# Patient Record
Sex: Female | Born: 1950
Health system: Southern US, Community
[De-identification: ages and names within clinical notes are randomized; demographics above are authoritative.]

## PROBLEM LIST (undated history)

## (undated) DIAGNOSIS — J302 Other seasonal allergic rhinitis: Secondary | ICD-10-CM

## (undated) DIAGNOSIS — J449 Chronic obstructive pulmonary disease, unspecified: Secondary | ICD-10-CM

## (undated) DIAGNOSIS — C439 Malignant melanoma of skin, unspecified: Secondary | ICD-10-CM

## (undated) HISTORY — DX: Chronic obstructive pulmonary disease, unspecified: J44.9

## (undated) HISTORY — DX: Malignant melanoma of skin, unspecified: C43.9

## (undated) HISTORY — PX: OVARIAN CYST REMOVAL: SHX89

## (undated) HISTORY — PX: ABDOMINAL HYSTERECTOMY: SHX81

## (undated) HISTORY — PX: APPENDECTOMY: SHX54

---

## 2005-04-03 ENCOUNTER — Encounter: Payer: Self-pay | Admitting: Family Medicine

## 2007-09-17 ENCOUNTER — Encounter: Payer: Self-pay | Admitting: Family Medicine

## 2008-11-23 ENCOUNTER — Encounter: Admission: RE | Admit: 2008-11-23 | Discharge: 2008-11-23 | Payer: Self-pay | Admitting: Sports Medicine

## 2009-04-20 ENCOUNTER — Ambulatory Visit: Payer: Self-pay | Admitting: Family Medicine

## 2009-04-20 DIAGNOSIS — C439 Malignant melanoma of skin, unspecified: Secondary | ICD-10-CM | POA: Insufficient documentation

## 2009-04-20 DIAGNOSIS — H6980 Other specified disorders of Eustachian tube, unspecified ear: Secondary | ICD-10-CM | POA: Insufficient documentation

## 2009-05-02 ENCOUNTER — Encounter: Payer: Self-pay | Admitting: Family Medicine

## 2009-05-02 LAB — CONVERTED CEMR LAB
Albumin: 4.4 g/dL (ref 3.5–5.2)
Alkaline Phosphatase: 76 units/L (ref 39–117)
BUN: 14 mg/dL (ref 6–23)
Cholesterol: 269 mg/dL — ABNORMAL HIGH (ref 0–200)
Glucose, Bld: 91 mg/dL (ref 70–99)
HDL: 53 mg/dL (ref >39)
LDL Cholesterol: 176 mg/dL — ABNORMAL HIGH (ref 0–99)
Potassium: 5 meq/L (ref 3.5–5.3)
Total Bilirubin: 0.5 mg/dL (ref 0.3–1.2)
Triglycerides: 201 mg/dL — ABNORMAL HIGH (ref <150)

## 2009-05-18 ENCOUNTER — Encounter: Payer: Self-pay | Admitting: Family Medicine

## 2009-05-18 ENCOUNTER — Other Ambulatory Visit: Admission: RE | Admit: 2009-05-18 | Discharge: 2009-05-18 | Payer: Self-pay | Admitting: Family Medicine

## 2009-05-18 ENCOUNTER — Ambulatory Visit: Payer: Self-pay | Admitting: Family Medicine

## 2009-05-18 DIAGNOSIS — E785 Hyperlipidemia, unspecified: Secondary | ICD-10-CM | POA: Insufficient documentation

## 2009-05-18 DIAGNOSIS — Z780 Asymptomatic menopausal state: Secondary | ICD-10-CM | POA: Insufficient documentation

## 2009-07-11 ENCOUNTER — Encounter: Admission: RE | Admit: 2009-07-11 | Discharge: 2009-07-11 | Payer: Self-pay | Admitting: Family Medicine

## 2009-07-11 ENCOUNTER — Encounter: Payer: Self-pay | Admitting: Family Medicine

## 2009-07-18 DIAGNOSIS — R928 Other abnormal and inconclusive findings on diagnostic imaging of breast: Secondary | ICD-10-CM | POA: Insufficient documentation

## 2009-07-25 ENCOUNTER — Encounter: Admission: RE | Admit: 2009-07-25 | Discharge: 2009-07-25 | Payer: Self-pay | Admitting: Family Medicine

## 2009-08-07 ENCOUNTER — Telehealth: Payer: Self-pay | Admitting: Family Medicine

## 2009-08-08 ENCOUNTER — Encounter: Payer: Self-pay | Admitting: Family Medicine

## 2009-08-10 ENCOUNTER — Encounter: Payer: Self-pay | Admitting: Family Medicine

## 2009-08-10 LAB — CONVERTED CEMR LAB
ALT: 37 units/L — ABNORMAL HIGH (ref 0–35)
ALT: 37 units/L — ABNORMAL HIGH (ref 0–35)
AST: 25 units/L (ref 0–37)
AST: 25 units/L (ref 0–37)
Alkaline Phosphatase: 85 units/L (ref 39–117)
Calcium: 9.2 mg/dL (ref 8.4–10.5)
Chloride: 108 meq/L (ref 96–112)
Creatinine, Ser: 0.78 mg/dL (ref 0.40–1.20)
LDL Cholesterol: 108 mg/dL — ABNORMAL HIGH (ref 0–99)
Total Bilirubin: 0.4 mg/dL (ref 0.3–1.2)
Total CHOL/HDL Ratio: 4.1

## 2009-09-22 ENCOUNTER — Ambulatory Visit: Payer: Self-pay | Admitting: Family Medicine

## 2009-09-22 DIAGNOSIS — J329 Chronic sinusitis, unspecified: Secondary | ICD-10-CM | POA: Insufficient documentation

## 2010-09-09 ENCOUNTER — Encounter: Payer: Self-pay | Admitting: Family Medicine

## 2010-09-18 NOTE — Assessment & Plan Note (Signed)
Summary: Sinusitis   Vital Signs:  Patient profile:   60 year old female Menstrual status:  partial hysterectomy Height:      66 inches Weight:      184 pounds BMI:     29.81 O2 Sat:      97 % on Room air Temp:     98.1 degrees F oral Pulse rate:   85 / minute BP sitting:   129 / 85  Vitals Entered By: Charolett Bumpers (September 22, 2009 9:20 AM)  O2 Flow:  Room air CC: cough   Primary Care Provider:  Seymour Bars DO  CC:  cough.  History of Present Illness: Cough for 1 week.  last week  aches and ST, no N/V/D or fever. No fever this week.  + flu contacts.  She is a smoker. Productive cough with yellow. No wheeze or SOB. Sinus pressure maxillary bilat. Lots of post nasal drip. Stopped her nasal spray. No OTC eds currently. No hx of respiratory problems.   Allergies: No Known Drug Allergies  Physical Exam  General:  Well-developed,well-nourished,in no acute distress; alert,appropriate and cooperative throughout examination Head:  Normocephalic and atraumatic without obvious abnormalities. No apparent alopecia or balding. Eyes:  No corneal or conjunctival inflammation noted. EOMI. Perrla.  Ears:  External ear exam shows no significant lesions or deformities.  Otoscopic examination reveals clear canals, tympanic membranes are intact bilaterally without bulging, retraction, inflammation or discharge. Hearing is grossly normal bilaterally. Nose:  External nasal examination shows no deformity or inflammation. Mouth:  Oral mucosa and oropharynx without lesions or exudates.  Teeth in good repair. Neck:  No deformities, masses, or tenderness noted. Lungs:  Normal respiratory effort, chest expands symmetrically. Lungs are clear to auscultation, no crackles or wheezes. Heart:  Normal rate and regular rhythm. S1 and S2 normal without gallop, murmur, click, rub or other extra sounds. Skin:  no rashes.   Cervical Nodes:  No lymphadenopathy noted Psych:  Cognition and judgment appear intact.  Alert and cooperative with normal attention span and concentration. No apparent delusions, illusions, hallucinations   Impression & Recommendations:  Problem # 1:  SINUSITIS (ICD-473.9)  Her updated medication list for this problem includes:    Nasonex 50 Mcg/act Susp (Mometasone furoate) .[name]... 2 sprays per nostril daily    Amoxicillin 500 Mg Cap (Amoxicillin) .[name]... Take 1 capsule by mouth three times a day x 10 days  Take antibiotics for full duration Call if not better in one week.   Complete Medication List: 1)  Otc Allergy Med  .... Take 1 tab by mouth once daily as needed 2)  Nasonex 50 Mcg/act Susp (Mometasone furoate) .... 2 sprays per nostril daily 3)  Crestor 10 Mg Tabs (Rosuvastatin calcium) .[name].. 1 tab by mouth qhs 4)  Vitamin D 50,000 Iu  .[name].. 1 capsule by mouth once a wk 5)  Amoxicillin 500 Mg Cap (Amoxicillin) .... Take 1 capsule by mouth three times a day x 10 days Prescriptions: AMOXICILLIN 500 MG CAP (AMOXICILLIN) Take 1 capsule by mouth three times a day X 10 days  #30 x 0   Entered and Authorized by:   Nani Gasser MD   Signed by:   Nani Gasser MD on 09/22/2009   Method used:   [facility]        [facility]  [address] [phone][facility] (retail[facility]       [address]       [place], [place]  [address]  Ph: [phone] or [phone]       Fax: [phone]   RxID:   [id number]

## 2010-09-20 ENCOUNTER — Encounter: Payer: Self-pay | Admitting: Family Medicine

## 2010-09-20 ENCOUNTER — Ambulatory Visit (INDEPENDENT_AMBULATORY_CARE_PROVIDER_SITE_OTHER): Payer: BC Managed Care – PPO | Admitting: Family Medicine

## 2010-09-20 DIAGNOSIS — J189 Pneumonia, unspecified organism: Secondary | ICD-10-CM | POA: Insufficient documentation
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
## 2010-09-27 ENCOUNTER — Telehealth: Payer: Self-pay | Admitting: Family Medicine

## 2010-09-28 ENCOUNTER — Telehealth: Payer: Self-pay | Admitting: Family Medicine

## 2010-10-01 ENCOUNTER — Telehealth: Payer: Self-pay | Admitting: Family Medicine

## 2010-10-04 ENCOUNTER — Encounter (INDEPENDENT_AMBULATORY_CARE_PROVIDER_SITE_OTHER): Payer: BC Managed Care – PPO | Admitting: Family Medicine

## 2010-10-04 ENCOUNTER — Encounter: Payer: Self-pay | Admitting: Family Medicine

## 2010-10-04 DIAGNOSIS — J4489 Other specified chronic obstructive pulmonary disease: Secondary | ICD-10-CM | POA: Insufficient documentation

## 2010-10-04 DIAGNOSIS — F17200 Nicotine dependence, unspecified, uncomplicated: Secondary | ICD-10-CM

## 2010-10-04 DIAGNOSIS — J449 Chronic obstructive pulmonary disease, unspecified: Secondary | ICD-10-CM

## 2010-10-04 NOTE — Progress Notes (Signed)
  Phone Note Other Incoming   Summary of Call: Call a nurse: Pt requesting rx for steroid inhaler.  I reviewed chart and noted Dr. Ovidio Kin plan to give pt symbicort today, so I authorized the nurse Aram Beecham at Call a nurse) to call in symbicort 160/4.5, 2 puffs two times a day, #1, no RF. Initial call taken by: Michell Heinrich M.D.,  September 28, 2010 9:19 PM

## 2010-10-04 NOTE — Progress Notes (Signed)
Summary: [facility]Cough  Phone Note Call from Patient Call back at Work Phone [phone] Call back at [phone] after 5   Caller: Patient Call For: Seymour Bars DO Reason for Call: Acute Illness Summary of Call: pt was seen last week and given antibiotics.  Pt was given albuterol inhaler,but respiratory therapist at pt's office suggested steroid inhaler.  Cough is worse and pt can not sleep at night.  Pt denies any fever, pt has productive cough.  Sputum is lighter in color and not as thick.  [facility]  Please advise.  pt has followup next week for physical. Initial call taken by: Francee Piccolo CMA Duncan Dull),  September 27, 2010 3:53 PM  Follow-up for Phone Call        ask her if she wants to pick up a Symbicort inhaler to use for the next wk and I will send an Rx for nighttime cough medicine to the pharmacy. Follow-up by: Seymour Bars DO,  September 28, 2010 12:45 PM    New/Updated Medications: Sandria Senter ER 10-8 MG/5ML LQCR (HYDROCOD POLST-CHLORPHEN POLST) 5 ml by mouth at bedtime as needed cough Prescriptions: TUSSIONEX PENNKINETIC ER 10-8 MG/5ML LQCR (HYDROCOD POLST-CHLORPHEN POLST) 5 ml by mouth at bedtime as needed cough  #100 ml x 0   Entered and Authorized by:   Seymour Bars DO   Signed by:   Seymour Bars DO on 09/28/2010   Method used:   Printed then faxed to ...       [facility]  [address] [phone]* (retail)       [address] [place], [place]  [address]       Ph: [phone] or [phone]       Fax: [phone]   RxID:   [id number]   Appended Document: [facility] Clinical Lists Changes  Pt needs symbicort sent to pharmacy.  She is unable to pick up sample by 5pm.  Advised cough syrup called in also. Francee Piccolo CMA Duncan Dull)  September 28, 2010 2:10 PM

## 2010-10-10 NOTE — Progress Notes (Signed)
Summary: CALL A NURSE  Phone Note Call from Patient   Caller: CALL A NURSE Summary of Call: [facility] Triage Call Report Triage Record Num: [id number] Operator: Caswell Corwin Patient Name: Olivia Frank Call Date & Time: 09/28/2010 8:47:27PM Patient Phone: [phone] PCP: Patient Gender: Female PCP Fax : Patient DOB: 08/14/1951 Practice Name: [facility] Reason for Call: Pt calling that she needs her steroid inhaler called into the Pharmacy. States she spoke with the ofc earlier and they were offered her a sample but she could not get to the ofc on time so they were going to call it in. Was in last week for presumed pneumonia and cough has gotten worse. Afebrile. Triaged cough and coughing up green sputum. Paged Dr. Earley Favor and he will look her up and call me back. Dr. Milinda Cave called back and ordered a Symbicort inhaler 160/4.5 2 puffs BID #1 with no refill. [facility] @ [phone] and spoke wtih Onalee Hua. Recalled pt and gave her info. Initial call taken by: Payton Spark CMA,  October 01, 2010 8:31 AM
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
## 2011-05-27 ENCOUNTER — Encounter: Payer: Self-pay | Admitting: Family Medicine

## 2011-05-27 ENCOUNTER — Inpatient Hospital Stay (INDEPENDENT_AMBULATORY_CARE_PROVIDER_SITE_OTHER)
Admission: RE | Admit: 2011-05-27 | Discharge: 2011-05-27 | Disposition: A | Discharge status: Home / Self Care | Payer: BC Managed Care – PPO | Source: Ambulatory Visit | Attending: Family Medicine | Admitting: Family Medicine

## 2011-05-27 ENCOUNTER — Telehealth: Payer: Self-pay | Admitting: Family Medicine

## 2011-05-27 ENCOUNTER — Emergency Department (HOSPITAL_COMMUNITY)
Admission: EM | Admit: 2011-05-27 | Discharge: 2011-05-27 | Discharge status: Against Medical Advice | Payer: BC Managed Care – PPO | Attending: Emergency Medicine | Admitting: Emergency Medicine

## 2011-05-27 DIAGNOSIS — M79609 Pain in unspecified limb: Secondary | ICD-10-CM | POA: Insufficient documentation

## 2011-05-27 DIAGNOSIS — IMO0002 Reserved for concepts with insufficient information to code with codable children: Secondary | ICD-10-CM | POA: Insufficient documentation

## 2011-05-27 NOTE — Telephone Encounter (Signed)
Pt called left a voicemail request to speak with nurse about infected thumb. Pt states she went to urgent care out of town and it still isn't any better. Pt request a call back [phone]

## 2011-05-27 NOTE — Telephone Encounter (Signed)
Called pt and she states she already spoke with someone in our office and was in UC as we spoke getting treated. KJ LPN

## 2011-05-28 ENCOUNTER — Ambulatory Visit: Payer: BC Managed Care – PPO | Admitting: Family Medicine

## 2011-05-28 ENCOUNTER — Telehealth (INDEPENDENT_AMBULATORY_CARE_PROVIDER_SITE_OTHER): Payer: Self-pay | Admitting: *Deleted

## 2011-07-22 NOTE — Telephone Encounter (Signed)
  Phone Note Call from Patient   Caller: Patient Summary of Call: 05/28/11- pt called and states that she went to [facility] long ED as instructed by dr Cathren Harsh. she left the ED without seeing Dr Melvyn Novas. She states that she paged him and he is going to see her in his office today.  05/29/11- spoke to pt she states that she saw dr Melvyn Novas on 05/28/11, he removed her fingernail, and sent a culture to the lab. advised her to f/u with dr Melvyn Novas as he reccomends. pt agrees/Marion Seese,LPN Initial call taken by: Clemens Catholic LPN,  May 28, 2011 10:18 AM
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
## 2011-10-08 ENCOUNTER — Ambulatory Visit (INDEPENDENT_AMBULATORY_CARE_PROVIDER_SITE_OTHER): Payer: BC Managed Care – PPO | Admitting: Physician Assistant

## 2011-10-08 ENCOUNTER — Encounter: Payer: Self-pay | Admitting: Physician Assistant

## 2011-10-08 VITALS — BP 146/83 | HR 76 | Wt 182.0 lb

## 2011-10-08 DIAGNOSIS — Z0000 Encounter for general adult medical examination without abnormal findings: Secondary | ICD-10-CM

## 2011-10-08 DIAGNOSIS — R200 Anesthesia of skin: Secondary | ICD-10-CM

## 2011-10-08 DIAGNOSIS — S46919A Strain of unspecified muscle, fascia and tendon at shoulder and upper arm level, unspecified arm, initial encounter: Secondary | ICD-10-CM

## 2011-10-08 DIAGNOSIS — E538 Deficiency of other specified B group vitamins: Secondary | ICD-10-CM

## 2011-10-08 DIAGNOSIS — IMO0002 Reserved for concepts with insufficient information to code with codable children: Secondary | ICD-10-CM

## 2011-10-08 DIAGNOSIS — R030 Elevated blood-pressure reading, without diagnosis of hypertension: Secondary | ICD-10-CM

## 2011-10-08 DIAGNOSIS — R209 Unspecified disturbances of skin sensation: Secondary | ICD-10-CM

## 2011-10-08 MED ORDER — CYCLOBENZAPRINE HCL 5 MG PO TABS: 5.0000 mg | ORAL_TABLET | Freq: Three times a day (TID) | ORAL | Status: DC | PRN

## 2011-10-08 MED ORDER — TRAMADOL HCL 50 MG PO TABS: 50.0000 mg | ORAL_TABLET | Freq: Four times a day (QID) | ORAL | Status: AC | PRN

## 2011-10-08 NOTE — Patient Instructions (Signed)
Can take regular ibuprofen up 800mg  three times a day. Try ice 20 minute couple times a day. Flexeril at night. Tramadol as needed for pain. Follow up next week. Get labs.

## 2011-10-08 NOTE — Progress Notes (Signed)
  Subjective:    Patient ID: Olivia Frank, female    DOB: 11-14-1950, 61 y.o.   MRN: [id number]  HPI Patient presents to the clinic with left arm pain and numbness. She has not had anything like this before and denies injury. The pain started Sunday night as mild pain in the upper left back shoulder blade region and now is numbness and tingling down left arm. It is gradually getting worse. She has tried [employer] and it has not helped. She denies chest pain, SOB, wheezing, jaw pain, dizziness, vision changes. Her father had multiple blockages and MI's. She grades pain 8/10. Nothing makes it worse or better.     Review of Systems     Objective:   Physical Exam  Constitutional: She is oriented to person, place, and time. She appears well-developed and well-nourished. No distress.  HENT:  Head: Normocephalic and atraumatic.  Cardiovascular: Normal rate, regular rhythm, normal heart sounds and intact distal pulses.   Pulmonary/Chest: Effort normal and breath sounds normal.  Musculoskeletal:       Arms:      Strength 5/5 both arms and wrist. Normal reflexes. Normal sensations felt bilaterally.  Neurological: She is alert and oriented to person, place, and time.  Skin: Skin is warm and dry. She is not diaphoretic.  Psychiatric: She has a normal mood and affect. Her behavior is normal.          Assessment & Plan:  Elevated blood pressure- patient wants to wait until next week to talk about medication for blood pressure. She not had blood pressure problems in the past but the last two visit have been elevated. We will discuss at CPE next week.   Numbness and tingling of left arm-EKG(NSR, good r wave progression, and no ST elevation)  Muscle strain of scapula, left side- Instructed to ice 20 min a couple times a day. Can take up to Ibuprfoen 800mg  TID. Gave flexeril to take at night along with warm bath and tramadol to take as needed for pain. I encouraged a massage would be the best  treatment.

## 2011-10-09 ENCOUNTER — Encounter: Payer: Self-pay | Admitting: *Deleted

## 2011-10-10 LAB — COMPREHENSIVE METABOLIC PANEL
ALT: 22 U/L (ref 0–35)
Alkaline Phosphatase: 77 U/L (ref 39–117)
Creat: 0.84 mg/dL (ref 0.50–1.10)
Sodium: 140 mEq/L (ref 135–145)
Total Bilirubin: 0.5 mg/dL (ref 0.3–1.2)
Total Protein: 6.4 g/dL (ref 6.0–8.3)

## 2011-10-10 LAB — LIPID PANEL
HDL: 53 mg/dL (ref >39)
LDL Cholesterol: 175 mg/dL — ABNORMAL HIGH (ref 0–99)
Total CHOL/HDL Ratio: 5 Ratio
Triglycerides: 197 mg/dL — ABNORMAL HIGH (ref <150)
VLDL: 39 mg/dL (ref 0–40)

## 2011-10-11 ENCOUNTER — Encounter: Payer: Self-pay | Admitting: *Deleted

## 2011-10-15 ENCOUNTER — Encounter: Payer: Self-pay | Admitting: Physician Assistant

## 2011-10-15 ENCOUNTER — Ambulatory Visit (INDEPENDENT_AMBULATORY_CARE_PROVIDER_SITE_OTHER): Payer: BC Managed Care – PPO | Admitting: Physician Assistant

## 2011-10-15 VITALS — BP 138/84 | HR 84 | Wt 185.0 lb

## 2011-10-15 DIAGNOSIS — Z1211 Encounter for screening for malignant neoplasm of colon: Secondary | ICD-10-CM

## 2011-10-15 DIAGNOSIS — Z1231 Encounter for screening mammogram for malignant neoplasm of breast: Secondary | ICD-10-CM

## 2011-10-15 DIAGNOSIS — Z720 Tobacco use: Secondary | ICD-10-CM

## 2011-10-15 DIAGNOSIS — Z1239 Encounter for other screening for malignant neoplasm of breast: Secondary | ICD-10-CM

## 2011-10-15 DIAGNOSIS — Z0000 Encounter for general adult medical examination without abnormal findings: Secondary | ICD-10-CM

## 2011-10-15 DIAGNOSIS — M791 Myalgia, unspecified site: Secondary | ICD-10-CM

## 2011-10-15 DIAGNOSIS — Z23 Encounter for immunization: Secondary | ICD-10-CM

## 2011-10-15 DIAGNOSIS — E785 Hyperlipidemia, unspecified: Secondary | ICD-10-CM

## 2011-10-15 MED ORDER — GABAPENTIN 100 MG PO CAPS: 100.0000 mg | ORAL_CAPSULE | Freq: Four times a day (QID) | ORAL | Status: DC

## 2011-10-15 NOTE — Progress Notes (Signed)
Subjective:     Olivia Frank is a 61 y.o. female and is here for a comprehensive physical exam. The patient reports that she is still having problems with her left shoulder and arm with pain and numbness. She has been using the flexeril, Ibuprofen, and stretches from last visit. She reports that it might have helped some but not much. She did not get a massage which she thinks would help. She denies dropping anything or any numbness in the left hand. Her strength has not been affected.   History   Social History  . Marital Status: Married    Spouse Name: N/A    Number of Children: N/A  . Years of Education: N/A   Occupational History  . Not on file.   Social History Main Topics  . Smoking status: Current Everyday Smoker -- 0.5 packs/day for 46 years    Types: Cigarettes  . Smokeless tobacco: Not on file  . Alcohol Use: No  . Drug Use: No  . Sexually Active: Not on file   Other Topics Concern  . Not on file   Social History Narrative  . No narrative on file   Health Maintenance  Topic Date Due  . Colonoscopy  08/23/2000  . Zostavax  08/23/2010  . Influenza Vaccine  05/20/2011  . Mammogram  07/26/2011  . Tetanus/tdap  05/19/2019    The following portions of the patient's history were reviewed and updated as appropriate: allergies, current medications, past family history, past medical history, past social history, past surgical history and problem list.  Review of Systems Pertinent items are noted in HPI.   Objective:    BP 138/84  Pulse 84  Wt 185 lb (83.915 kg) General appearance: alert, cooperative and appears stated age Head: Normocephalic, without obvious abnormality, atraumatic Eyes: conjunctivae/corneas clear. PERRL, EOM's intact. Fundi benign. Ears: normal TM's and external ear canals both ears Nose: Nares normal. Septum midline. Mucosa normal. No drainage or sinus tenderness. Throat: lips, mucosa, and tongue normal; teeth and gums normal Neck: no adenopathy,  no carotid bruit, no JVD, supple, symmetrical, trachea midline and thyroid not enlarged, symmetric, no tenderness/mass/nodules Back: symmetric, no curvature. ROM normal. No CVA tenderness., Tenderness to palpation over the trapizeus muscle on the left side extending from base of neck down to acromion process. No pain with side to side movements of head and flexion and extentsion.  Lungs: clear to auscultation bilaterally Breasts: normal appearance, no masses or tenderness, Inspection negative, No nipple retraction or dimpling Heart: regular rate and rhythm, S1, S2 normal, no murmur, click, rub or gallop Abdomen: soft, non-tender; bowel sounds normal; no masses,  no organomegaly Extremities: extremities normal, atraumatic, no cyanosis or edema and Tenderness with palpation of the ulnar nerve over the olecranon process.Strenth 5/5 in all extemites and cervical spine. Pulses: 2+ and symmetric  Neurological: Alert and oriented times 3. Cranial Nerves II-XII intact.   Assessment:    Healthy female exam.       Plan:  Will order Mammogram and Colonoscopy. Someone will call to schedule.  Zostavax was given.   Discussed tobacco cessation- Patient not ready to quit. Will schedule spirometry due to history of smoking.  Left muscle pain with numbness and tingling- Continue treating as a muscle sprain with heat and ice alternating. Flexeril as needed at night. Do stretches daily to left arm and shoulder. Follow up if not improving and we will get Xray of cervical neck and follow up with EMG to further evaluate numbness and  tingling.  Dyslipidemia-LDL at 175. Triglycerides at 197. Patient does not want to start medications at this time. She will consider Fish Oil 1000mg  TID and she states she will start dieting and exercise. She knows she needs to lose weight and she wants to try this. Will Recheck in 3 months.  Denied Dexa for this year but instructed to make sure she was taking at least 500mg  BID or at  least 4 servings of dairy.    See After Visit Summary for Counseling Recommendations

## 2011-10-15 NOTE — Patient Instructions (Addendum)
Discussed with you the importance of weight loss and exercise to decrease your cholesterol and to improve your blood pressure. We need to recheck in 3 months and if not decreasing then we need to start medication. Consider Calcium 500mg  twice a day or 4 servings of dairy daily. Will call with mammogram and colonoscopy.Consideer starting Fish Oil 1000mg  three times a day for cholesterol.

## 2011-10-23 ENCOUNTER — Telehealth: Payer: Self-pay | Admitting: *Deleted

## 2011-11-06 ENCOUNTER — Ambulatory Visit
Admission: RE | Admit: 2011-11-06 | Discharge: 2011-11-06 | Disposition: A | Discharge status: Home / Self Care | Payer: BC Managed Care – PPO | Source: Ambulatory Visit | Attending: Physician Assistant | Admitting: Physician Assistant

## 2011-11-06 DIAGNOSIS — Z1231 Encounter for screening mammogram for malignant neoplasm of breast: Secondary | ICD-10-CM

## 2012-01-30 ENCOUNTER — Encounter: Payer: Self-pay | Admitting: Family Medicine

## 2012-01-30 ENCOUNTER — Ambulatory Visit (INDEPENDENT_AMBULATORY_CARE_PROVIDER_SITE_OTHER): Payer: BC Managed Care – PPO | Admitting: Family Medicine

## 2012-01-30 VITALS — BP 142/81 | HR 81 | Temp 98.3°F

## 2012-01-30 DIAGNOSIS — H68012 Acute Eustachian salpingitis, left ear: Secondary | ICD-10-CM

## 2012-01-30 DIAGNOSIS — H6120 Impacted cerumen, unspecified ear: Secondary | ICD-10-CM

## 2012-01-30 DIAGNOSIS — H68019 Acute Eustachian salpingitis, unspecified ear: Secondary | ICD-10-CM

## 2012-01-30 MED ORDER — FEXOFENADINE-PSEUDOEPHED ER 180-240 MG PO TB24: 1.0000 | ORAL_TABLET | Freq: Every day | ORAL | Status: DC

## 2012-01-30 MED ORDER — FLUTICASONE PROPIONATE 50 MCG/ACT NA SUSP: 2.0000 | Freq: Every day | NASAL | Status: DC

## 2012-01-30 MED ORDER — CEFUROXIME AXETIL 500 MG PO TABS: 500.0000 mg | ORAL_TABLET | Freq: Two times a day (BID) | ORAL | Status: AC

## 2012-01-30 NOTE — Patient Instructions (Signed)
Barotitis Media Barotitis media is soreness (inflammation) of the area behind the eardrum (middle ear). This occurs when the auditory tube (Eustachian tube) leading from the back of the throat to the eardrum is blocked. When it is blocked air cannot move in and out of the middle ear to equalize pressure changes. These pressure changes come from changes in altitude when:  Flying.   Driving in the mountains.   Diving.  Problems are more likely to occur with pressure changes during times when you are congested as from:  Hay fever.   Upper respiratory infection.   A cold.  Damage or hearing loss (barotrauma) caused by this may be permanent. HOME CARE INSTRUCTIONS   Use medicines as recommended by your caregiver. Over the counter medicines will help unblock the canal and can help during times of air travel.   Do not put anything into your ears to clean or unplug them. Eardrops will not be helpful.   Do not swim, dive, or fly until your caregiver says it is all right to do so. If these activities are necessary, chewing gum with frequent swallowing may help. It is also helpful to hold your nose and gently blow to pop your ears for equalizing pressure changes. This forces air into the Eustachian tube.   For little ones with problems, give your baby a bottle of water or juice during periods when pressure changes would be anticipated such as during take offs and landings associated with air travel.   Only take over-the-counter or prescription medicines for pain, discomfort, or fever as directed by your caregiver.   A decongestant may be helpful in de-congesting the middle ear and make pressure equalization easier. This can be even more effective if the drops (spray) are delivered with the head lying over the edge of a bed with the head tilted toward the ear on the affected side.   If your caregiver has given you a follow-up appointment, it is very important to keep that appointment. Not keeping  the appointment could result in a chronic or permanent injury, pain, hearing loss and disability. If there is any problem keeping the appointment, you must call back to this facility for assistance.  SEEK IMMEDIATE MEDICAL CARE IF:   You develop a severe headache, dizziness, severe ear pain, or bloody or pus-like drainage from your ears.   An oral temperature above 102 F (38.9 C) develops.   Your problems do not improve or become worse.  MAKE SURE YOU:   Understand these instructions.   Will watch your condition.   Will get help right away if you are not doing well or get worse.  Document Released: 08/02/2000 Document Revised: 07/25/2011 Document Reviewed: 03/10/2008 Exit[employer] Patient Information 2012 [employer]. 

## 2012-01-30 NOTE — Progress Notes (Signed)
  Subjective:    Patient ID: Olivia Frank, female    DOB: 07-03-1951, 61 y.o.   MRN: [id number]  Otalgia  There is pain in the right ear. The current episode started in the past 7 days (actually Saturday on flight from [place]). The problem occurs constantly. The problem has been waxing and waning. There has been no fever. The pain is at a severity of 10/10 (2/10 now ). Pertinent negatives include no abdominal pain, coughing, diarrhea, drainage, ear discharge, headaches, hearing loss, neck pain, rash, rhinorrhea, sore throat or vomiting. Treatments tried: blowing nose and chewing gum. The treatment provided mild relief. There is no history of a chronic ear infection, hearing loss or a tympanostomy tube.      Review of Systems  HENT: Positive for ear pain. Negative for hearing loss, sore throat, rhinorrhea, neck pain and ear discharge.   Respiratory: Negative for cough.   Gastrointestinal: Negative for vomiting, abdominal pain and diarrhea.  Skin: Negative for rash.  Neurological: Negative for headaches.  All other systems reviewed and are negative.       Objective:   Physical Exam  Vitals reviewed. Constitutional: She is oriented to person, place, and time. She appears well-developed and well-nourished. No distress.  HENT:  Head: Normocephalic and atraumatic.  Right Ear: Tympanic membrane normal.  Left Ear: Tympanic membrane normal.  Nose: Nose normal.  Mouth/Throat: Oropharynx is clear and moist. No oral lesions.       Initially most of the right ear canal was obstructed with complete obstruction of the left. One attempt was made to remove the wax out of the left ear oh subsequent pain that was stopped and plans placed for irritation.   Eyes: Conjunctivae are normal.  Neck: Neck supple.  Lymphadenopathy:    She has no cervical adenopathy.  Neurological: She is alert and oriented to person, place, and time.  Skin: Skin is warm and dry.  Psychiatric: She has a normal mood and  affect.   Both years were irrigated with improvement and good visualization of normal TMs bilaterally. She still had discomfort over what I would call her left eustachian tube area so we'll place her on medication.     Assessment & Plan:   #1 excessive wax in both years resolved  #2 eustachian tube dysfunction. We'll place on Ceftin 500 mg twice a day , Allegra D. one tablet a day told her she does need to check her blood pressure since she has a history of borderline blood pressure and Flonase nasal. Hopefully she'll be okay to fly out of town on Tuesday,.

## 2012-04-14 ENCOUNTER — Ambulatory Visit (INDEPENDENT_AMBULATORY_CARE_PROVIDER_SITE_OTHER): Payer: BC Managed Care – PPO

## 2012-04-14 ENCOUNTER — Ambulatory Visit: Payer: BC Managed Care – PPO | Admitting: Sports Medicine

## 2012-04-14 ENCOUNTER — Other Ambulatory Visit: Payer: Self-pay | Admitting: Sports Medicine

## 2012-04-14 ENCOUNTER — Ambulatory Visit (INDEPENDENT_AMBULATORY_CARE_PROVIDER_SITE_OTHER): Payer: BC Managed Care – PPO | Admitting: Sports Medicine

## 2012-04-14 ENCOUNTER — Ambulatory Visit: Payer: BC Managed Care – PPO

## 2012-04-14 ENCOUNTER — Encounter: Payer: Self-pay | Admitting: Sports Medicine

## 2012-04-14 VITALS — BP 130/75 | HR 86 | Temp 97.6°F | Wt 179.0 lb

## 2012-04-14 DIAGNOSIS — M545 Low back pain, unspecified: Secondary | ICD-10-CM

## 2012-04-14 DIAGNOSIS — IMO0001 Reserved for inherently not codable concepts without codable children: Secondary | ICD-10-CM | POA: Insufficient documentation

## 2012-04-14 MED ORDER — MELOXICAM 15 MG PO TABS: ORAL_TABLET | ORAL | Status: AC

## 2012-04-14 MED ORDER — CYCLOBENZAPRINE HCL 10 MG PO TABS: ORAL_TABLET | ORAL | Status: AC

## 2012-04-14 NOTE — Assessment & Plan Note (Signed)
Trigger point injection x2 as above. Home rehabilitation. Mobic. Short course of cyclobenzaprine. X-rays, history of melanoma. I will see her back in 4 weeks, and if no better we will consider MRI.

## 2012-04-14 NOTE — Progress Notes (Signed)
Patient ID: Olivia Frank, female   DOB: 1951-07-27, 61 y.o.   MRN: [id number] Subjective:    CC: low-back pain  HPI: Marykate is a very pleasant 61 year old female who comes in with a 4 week history of pain localized to the, as well as left-sided lower lumbar spine. She does not recall any specific inciting events, but notes that it has progressed. Currently is worse when bending forward, and worse with long car rides. It radiates down into the buttock, but not past the knee. It's dull in nature, and worse with palpation. She did try some Aleve which was only moderately efficacious. She does have a history of melanoma, but is uncertain as to the stage. She denies any bowel, or bladder incontinence, saddle anesthesia.  Past medical history, Surgical history, Family history, Social history, Allergies, and medications have been entered into the medical record, reviewed, and no changes needed.   Review of Systems: No fevers, chills, night sweats, weight loss, chest pain, or shortness of breath.   Objective:    General: Well Developed, well nourished, and in no acute distress.  Neuro: Alert and oriented x3, extra-ocular muscles intact.  HEENT: Normocephalic, atraumatic, pupils equal round reactive to light, neck supple, no masses, no lymphadenopathy, thyroid nonpalpable.  Skin: Warm and dry, no rashes. Cardiac: Regular rate and rhythm, no murmurs rubs or gallops.  Respiratory: Clear to auscultation bilaterally. Not using accessory muscles, speaking in full sentences. Back Exam:  Inspection: Unremarkable  Motion: Flexion 45 deg, Extension 45 deg, Side Bending to 45 deg bilaterally,  Rotation to 45 deg bilaterally  SLR laying: Negative  XSLR laying: Negative  Palpable tenderness: midline lower lumbar, as was left paralumbar near the sacroiliac joint. FABER: negative. Sensory change: Gross sensation intact to all lumbar and sacral dermatomes.  Reflexes: 2+ at both patellar tendons, 2+ at achilles  tendons, Babinski's downgoing.  Strength at foot  Plantar-flexion: 5/5 Dorsi-flexion: 5/5 Eversion: 5/5 Inversion: 5/5  Leg strength  Quad: 5/5 Hamstring: 5/5 Hip flexor: 5/5 Hip abductors: 5/5  Gait unremarkable.  Procedure:  Injection of midline, and paralumbar spasm. Consent obtained and verified. Time-out conducted. Noted no overlying erythema, induration, or other signs of local infection. Skin prepped in a sterile fashion. Topical analgesic spray: Ethyl chloride. Completed without difficulty. Meds:a total of 1 cc Kenalog 40, 4 cc lidocaine divided between 2 trigger points. Pain immediately improved suggesting accurate placement of the medication. Advised to call if fevers/chills, erythema, induration, drainage, or persistent bleeding.   Impression and Recommendations:

## 2012-04-16 ENCOUNTER — Telehealth: Payer: Self-pay | Admitting: *Deleted

## 2012-04-16 NOTE — Telephone Encounter (Signed)
Pt informed

## 2012-04-16 NOTE — Telephone Encounter (Signed)
Pt is calling asking about her xr results. States she is also still having back pain.

## 2012-04-16 NOTE — Telephone Encounter (Signed)
Good news, the x-rays were negative. She should continue with conservative therapy as previously agreed upon. I will see her back in 4 week mark, and we can consider MRI at that point if no better.

## 2012-05-12 ENCOUNTER — Ambulatory Visit (INDEPENDENT_AMBULATORY_CARE_PROVIDER_SITE_OTHER): Payer: BC Managed Care – PPO | Admitting: Sports Medicine

## 2012-05-12 ENCOUNTER — Encounter: Payer: Self-pay | Admitting: Sports Medicine

## 2012-05-12 VITALS — BP 147/88 | HR 97 | Wt 178.0 lb

## 2012-05-12 DIAGNOSIS — IMO0001 Reserved for inherently not codable concepts without codable children: Secondary | ICD-10-CM

## 2012-05-12 NOTE — Progress Notes (Signed)
Subjective:    CC: Followup low back pain  HPI: Olivia Frank comes back for followup of back pain that she was having in the midline lumbar spine. I performed 2 trigger point injections 4 weeks ago, prescribed a small concoction of medications, and placed her in a home rehabilitation program. She returns today with pain completely resolved.  Past medical history, Surgical history, Family history, Social history, Allergies, and medications have been entered into the medical record, reviewed, and no changes needed.   Review of Systems: No fevers, chills, night sweats, weight loss, chest pain, or shortness of breath.   Objective:    General: Well Developed, well nourished, and in no acute distress.  Back Exam:  Inspection: Unremarkable  Motion: Flexion 45 deg, Extension 45 deg, Side Bending to 45 deg bilaterally,  Rotation to 45 deg bilaterally  SLR laying: Negative  XSLR laying: Negative  Palpable tenderness: None. FABER: negative. Sensory change: Gross sensation intact to all lumbar and sacral dermatomes.  Reflexes: 2+ at both patellar tendons, 2+ at achilles tendons, Babinski's downgoing.  Strength at foot  Plantar-flexion: 5/5 Dorsi-flexion: 5/5 Eversion: 5/5 Inversion: 5/5  Leg strength  Quad: 5/5 Hamstring: 5/5 Hip flexor: 5/5 Hip abductors: 5/5  Gait unremarkable.    Impression and Recommendations:

## 2012-05-12 NOTE — Assessment & Plan Note (Signed)
This was likely related to para lumbar, as well as interspinous spasm. Symptoms are resolved. She will incorporate her rehabilitation into her daily life. She can always come back to see me should her symptoms return.

## 2013-10-08 ENCOUNTER — Telehealth: Payer: Self-pay

## 2013-10-08 ENCOUNTER — Ambulatory Visit (INDEPENDENT_AMBULATORY_CARE_PROVIDER_SITE_OTHER): Payer: Managed Care, Other (non HMO)

## 2013-10-08 ENCOUNTER — Ambulatory Visit (INDEPENDENT_AMBULATORY_CARE_PROVIDER_SITE_OTHER): Payer: Managed Care, Other (non HMO) | Admitting: Sports Medicine

## 2013-10-08 VITALS — BP 154/95 | HR 87 | Ht 64.0 in | Wt 181.0 lb

## 2013-10-08 DIAGNOSIS — M25532 Pain in left wrist: Secondary | ICD-10-CM

## 2013-10-08 DIAGNOSIS — M25521 Pain in right elbow: Secondary | ICD-10-CM

## 2013-10-08 DIAGNOSIS — S52123A Displaced fracture of head of unspecified radius, initial encounter for closed fracture: Secondary | ICD-10-CM

## 2013-10-08 DIAGNOSIS — S52121A Displaced fracture of head of right radius, initial encounter for closed fracture: Secondary | ICD-10-CM | POA: Insufficient documentation

## 2013-10-08 DIAGNOSIS — W010XXA Fall on same level from slipping, tripping and stumbling without subsequent striking against object, initial encounter: Secondary | ICD-10-CM

## 2013-10-08 DIAGNOSIS — M25539 Pain in unspecified wrist: Secondary | ICD-10-CM

## 2013-10-08 DIAGNOSIS — S63502A Unspecified sprain of left wrist, initial encounter: Secondary | ICD-10-CM | POA: Insufficient documentation

## 2013-10-08 MED ORDER — MELOXICAM 15 MG PO TABS: ORAL_TABLET | ORAL | Status: DC

## 2013-10-08 MED ORDER — TRAMADOL HCL 50 MG PO TABS: ORAL_TABLET | ORAL | Status: DC

## 2013-10-08 NOTE — Progress Notes (Signed)
  Subjective:    CC: Slip and fall  HPI: This is a pleasant 63 year old female, she was trying to slide on the ice, and unfortunately slipped and fell backwards impacting her right elbow and left wrist. She had immediate pain over her right elbow with inability to use, as well as pain over the dorsum of her dorsal distal wrist on the left side. Pain is moderate, persistent. She has not yet taken any medications for this.  Past medical history, Surgical history, Family history not pertinant except as noted below, Social history, Allergies, and medications have been entered into the medical record, reviewed, and no changes needed.   Review of Systems: No fevers, chills, night sweats, weight loss, chest pain, or shortness of breath.   Objective:    General: Well Developed, well nourished, and in no acute distress.  Neuro: Alert and oriented x3, extra-ocular muscles intact, sensation grossly intact.  HEENT: Normocephalic, atraumatic, pupils equal round reactive to light, neck supple, no masses, no lymphadenopathy, thyroid nonpalpable.  Skin: Warm and dry, no rashes. Cardiac: Regular rate and rhythm, no murmurs rubs or gallops, no lower extremity edema.  Respiratory: Clear to auscultation bilaterally. Not using accessory muscles, speaking in full sentences. Right Elbow: Unremarkable to inspection. Range of motion full pronation, supination, flexion, extension. Strength is full to all of the above directions Stable to varus, valgus stress. Negative moving valgus stress test. Tender to palpation over the radial head with reproduction of pain with supination. Ulnar nerve does not sublux. Negative cubital tunnel Tinel's. Left Wrist: Inspection normal with no visible erythema or swelling. ROM smooth and normal with good flexion and extension and ulnar/radial deviation that is symmetrical with opposite wrist. Tender to palpation over the distal radius. No snuffbox tenderness. No tenderness over  [place]. Strength 5/5 in all directions without pain. Negative Finkelstein, tinel's and phalens. Negative Watson's test.  X-rays were reviewed, wrist x-rays of the left side are negative for fracture, there are trapezial metacarpal and scaphoid trapezial degenerative changes. Elbow x-rays do show a nondisplaced fracture of the radial head.  Impression and Recommendations:

## 2013-10-08 NOTE — Assessment & Plan Note (Signed)
Localized over the distal radius and radial styloid process. X-rays, Velcro wrist brace. Return in 2 weeks.

## 2013-10-08 NOTE — Telephone Encounter (Signed)
Noted, patient contacted.

## 2013-10-08 NOTE — Telephone Encounter (Signed)
Cassandra from [facility] called and stated that patient has a mildly displaced radial head fracture. She stated the the report would be in epic. Rhonda Cunningham,CMA

## 2013-10-08 NOTE — Assessment & Plan Note (Addendum)
X-rays, sling, tramadol. I do suspect nondisplaced fracture of the radial head. Return to see me in 2 weeks. X-rays do show fracture, continue sling.  I billed a fracture code for this visit, all subsequent visits for this complaint will be "post-op checks" in the global period.

## 2013-10-22 ENCOUNTER — Ambulatory Visit (INDEPENDENT_AMBULATORY_CARE_PROVIDER_SITE_OTHER): Payer: Managed Care, Other (non HMO) | Admitting: Sports Medicine

## 2013-10-22 ENCOUNTER — Encounter: Payer: Self-pay | Admitting: Sports Medicine

## 2013-10-22 VITALS — BP 119/72 | HR 92 | Ht 65.0 in | Wt 178.0 lb

## 2013-10-22 DIAGNOSIS — S52121A Displaced fracture of head of right radius, initial encounter for closed fracture: Secondary | ICD-10-CM

## 2013-10-22 DIAGNOSIS — S63502A Unspecified sprain of left wrist, initial encounter: Secondary | ICD-10-CM

## 2013-10-22 DIAGNOSIS — S63509A Unspecified sprain of unspecified wrist, initial encounter: Secondary | ICD-10-CM

## 2013-10-22 NOTE — Assessment & Plan Note (Signed)
Clinically healed, return as needed for this.

## 2013-10-22 NOTE — Assessment & Plan Note (Signed)
Doing well 2 weeks out. Discontinue sling. Return in one month.

## 2013-10-22 NOTE — Progress Notes (Signed)
  Subjective:    CC: Recheck fracture  HPI: Left wrist sprain: Pain is now completely resolved.  Right radial head fracture: 2 weeks out, she has self discontinued the sling, mild pain with taking her arm through the range of motion but otherwise doing well.  Past medical history, Surgical history, Family history not pertinant except as noted below, Social history, Allergies, and medications have been entered into the medical record, reviewed, and no changes needed.   Review of Systems: No fevers, chills, night sweats, weight loss, chest pain, or shortness of breath.   Objective:    General: Well Developed, well nourished, and in no acute distress.  Neuro: Alert and oriented x3, extra-ocular muscles intact, sensation grossly intact.  HEENT: Normocephalic, atraumatic, pupils equal round reactive to light, neck supple, no masses, no lymphadenopathy, thyroid nonpalpable.  Skin: Warm and dry, no rashes. Cardiac: Regular rate and rhythm, no murmurs rubs or gallops, no lower extremity edema.  Respiratory: Clear to auscultation bilaterally. Not using accessory muscles, speaking in full sentences. Left Wrist: Inspection normal with no visible erythema or swelling. ROM smooth and normal with good flexion and extension and ulnar/radial deviation that is symmetrical with opposite wrist. Palpation is normal over metacarpals, navicular, lunate, and TFCC; tendons without tenderness/ swelling No snuffbox tenderness. No tenderness over [place]. Strength 5/5 in all directions without pain. Negative Finkelstein, tinel's and phalens. Negative Watson's test. Right Elbow: Unremarkable to inspection. Range of motion full pronation, supination, flexion, extension. Strength is full to all of the above directions Stable to varus, valgus stress. Negative moving valgus stress test. Tender palpation over the radial head, reproduction of pain with pronation and supination. Ulnar nerve does not  sublux. Negative cubital tunnel Tinel's.  Impression and Recommendations:

## 2013-11-18 ENCOUNTER — Ambulatory Visit: Payer: Managed Care, Other (non HMO) | Admitting: Sports Medicine

## 2013-11-24 ENCOUNTER — Ambulatory Visit (INDEPENDENT_AMBULATORY_CARE_PROVIDER_SITE_OTHER): Payer: Managed Care, Other (non HMO) | Admitting: Sports Medicine

## 2013-11-24 ENCOUNTER — Encounter: Payer: Self-pay | Admitting: Sports Medicine

## 2013-11-24 VITALS — BP 145/84 | HR 99 | Ht 65.0 in | Wt 181.0 lb

## 2013-11-24 DIAGNOSIS — S52121A Displaced fracture of head of right radius, initial encounter for closed fracture: Secondary | ICD-10-CM

## 2013-11-24 DIAGNOSIS — S52123A Displaced fracture of head of unspecified radius, initial encounter for closed fracture: Secondary | ICD-10-CM

## 2013-11-24 NOTE — Progress Notes (Signed)
  Subjective: 6 weeks post right radial head fracture, no pain, good motion, with throwing football this past weekend.   Objective: General: Well-developed, well-nourished, and in no acute distress. Right Elbow: Unremarkable to inspection. Range of motion full pronation, supination, flexion, extension. Strength is full to all of the above directions Stable to varus, valgus stress. Negative moving valgus stress test. No discrete areas of tenderness to palpation. Ulnar nerve does not sublux. Negative cubital tunnel Tinel's.  Assessment/plan:

## 2013-11-24 NOTE — Assessment & Plan Note (Signed)
Completely resolved, return as needed. 

## 2014-11-29 ENCOUNTER — Encounter: Payer: Self-pay | Admitting: Sports Medicine

## 2014-11-29 ENCOUNTER — Ambulatory Visit (INDEPENDENT_AMBULATORY_CARE_PROVIDER_SITE_OTHER): Payer: Managed Care, Other (non HMO)

## 2014-11-29 ENCOUNTER — Other Ambulatory Visit: Payer: Self-pay | Admitting: Physician Assistant

## 2014-11-29 ENCOUNTER — Ambulatory Visit (INDEPENDENT_AMBULATORY_CARE_PROVIDER_SITE_OTHER): Payer: Managed Care, Other (non HMO) | Admitting: Sports Medicine

## 2014-11-29 VITALS — BP 139/68 | HR 74 | Ht 65.0 in | Wt 166.0 lb

## 2014-11-29 DIAGNOSIS — M170 Bilateral primary osteoarthritis of knee: Secondary | ICD-10-CM

## 2014-11-29 DIAGNOSIS — M25461 Effusion, right knee: Secondary | ICD-10-CM | POA: Diagnosis not present

## 2014-11-29 DIAGNOSIS — Z1231 Encounter for screening mammogram for malignant neoplasm of breast: Secondary | ICD-10-CM

## 2014-11-29 DIAGNOSIS — M25462 Effusion, left knee: Secondary | ICD-10-CM

## 2014-11-29 NOTE — Assessment & Plan Note (Signed)
Unfortunately has not responded to oral NSAIDs. She also has a history of melanoma excision bilaterally on both knees, she now has pain with swelling. Aspiration and injection, x-rays. Considering local excision of melanoma we are going to obtain MRIs.

## 2014-11-29 NOTE — Progress Notes (Signed)
   Subjective:    I'm seeing this patient as a consultation for:  [name], PA-C  CC: Bilateral knee pain  HPI: This is a pleasant 64 year old female, for the past several weeks she's had increasing pain and swelling in both knees, left worse than right, she does have a history of bilateral anterior knee melanoma excision approximately 8 years ago. She denies any mechanical symptoms, she does have gelling, and pain at the joint lines as well as the suprapatellar recess with decreased range of motion. She is tried oral naproxen without any improvement.  Past medical history, Surgical history, Family history not pertinant except as noted below, Social history, Allergies, and medications have been entered into the medical record, reviewed, and no changes needed.   Review of Systems: No headache, visual changes, nausea, vomiting, diarrhea, constipation, dizziness, abdominal pain, skin rash, fevers, chills, night sweats, weight loss, swollen lymph nodes, body aches, joint swelling, muscle aches, chest pain, shortness of breath, mood changes, visual or auditory hallucinations.   Objective:   General: Well Developed, well nourished, and in no acute distress.  Neuro/Psych: Alert and oriented x3, extra-ocular muscles intact, able to move all 4 extremities, sensation grossly intact. Skin: Warm and dry, no rashes noted.  Respiratory: Not using accessory muscles, speaking in full sentences, trachea midline.  Cardiovascular: Pulses palpable, no extremity edema. Abdomen: Does not appear distended. Bilateral knees: Visible scar on the top of the above the patella bilaterally from melanoma excision, there is a bilateral fluid wave with a palpable effusion, and tenderness at the medial and lateral joint lines. ROM normal in flexion and extension and lower leg rotation. Ligaments with solid consistent endpoints including ACL, PCL, LCL, MCL. Negative Mcmurray's and provocative meniscal tests. Non painful  patellar compression. Patellar and quadriceps tendons unremarkable. Hamstring and quadriceps strength is normal.  Procedure: Real-time Ultrasound Guided aspiration/Injection of right knee Device: GE Logiq E  Verbal informed consent obtained.  Time-out conducted.  Noted no overlying erythema, induration, or other signs of local infection.  Skin prepped in a sterile fashion.  Local anesthesia: Topical Ethyl chloride.  With sterile technique and under real time ultrasound guidance:  20 mL of straw-colored fluid aspirated, syringe switched and 2 mL kenalog 40, 4 mL lidocaine injected. Completed without difficulty  Pain immediately resolved suggesting accurate placement of the medication.  Advised to call if fevers/chills, erythema, induration, drainage, or persistent bleeding.  Images permanently stored and available for review in the ultrasound unit.  Impression: Technically successful ultrasound guided injection.  Procedure: Real-time Ultrasound Guided Injection of left knee Device: GE Logiq E  Verbal informed consent obtained.  Time-out conducted.  Noted no overlying erythema, induration, or other signs of local infection.  Skin prepped in a sterile fashion.  Local anesthesia: Topical Ethyl chloride.  With sterile technique and under real time ultrasound guidance:  2 mL kenalog 40, 4 mL lidocaine injected easily into the suprapatellar recess. Completed without difficulty  Pain immediately resolved suggesting accurate placement of the medication.  Advised to call if fevers/chills, erythema, induration, drainage, or persistent bleeding.  Images permanently stored and available for review in the ultrasound unit.  Impression: Technically successful ultrasound guided injection.  Impression and Recommendations:   This case required medical decision making of moderate complexity.

## 2014-12-08 ENCOUNTER — Ambulatory Visit (INDEPENDENT_AMBULATORY_CARE_PROVIDER_SITE_OTHER): Payer: Managed Care, Other (non HMO)

## 2014-12-08 DIAGNOSIS — Z1231 Encounter for screening mammogram for malignant neoplasm of breast: Secondary | ICD-10-CM | POA: Diagnosis not present

## 2014-12-12 ENCOUNTER — Encounter: Payer: Managed Care, Other (non HMO) | Admitting: Physician Assistant

## 2014-12-19 ENCOUNTER — Other Ambulatory Visit: Payer: Managed Care, Other (non HMO)

## 2014-12-27 ENCOUNTER — Ambulatory Visit: Payer: Managed Care, Other (non HMO) | Admitting: Sports Medicine

## 2015-05-17 ENCOUNTER — Encounter: Payer: Self-pay | Admitting: Physician Assistant

## 2015-05-17 ENCOUNTER — Ambulatory Visit (INDEPENDENT_AMBULATORY_CARE_PROVIDER_SITE_OTHER): Payer: Managed Care, Other (non HMO) | Admitting: Physician Assistant

## 2015-05-17 VITALS — BP 155/88 | HR 88 | Temp 97.7°F | Wt 164.0 lb

## 2015-05-17 DIAGNOSIS — Z131 Encounter for screening for diabetes mellitus: Secondary | ICD-10-CM | POA: Diagnosis not present

## 2015-05-17 DIAGNOSIS — Z1159 Encounter for screening for other viral diseases: Secondary | ICD-10-CM | POA: Diagnosis not present

## 2015-05-17 DIAGNOSIS — IMO0001 Reserved for inherently not codable concepts without codable children: Secondary | ICD-10-CM

## 2015-05-17 DIAGNOSIS — Z1211 Encounter for screening for malignant neoplasm of colon: Secondary | ICD-10-CM

## 2015-05-17 DIAGNOSIS — Z0000 Encounter for general adult medical examination without abnormal findings: Secondary | ICD-10-CM

## 2015-05-17 DIAGNOSIS — Z638 Other specified problems related to primary support group: Secondary | ICD-10-CM

## 2015-05-17 DIAGNOSIS — Z13228 Encounter for screening for other metabolic disorders: Secondary | ICD-10-CM

## 2015-05-17 DIAGNOSIS — F439 Reaction to severe stress, unspecified: Secondary | ICD-10-CM

## 2015-05-17 DIAGNOSIS — H6123 Impacted cerumen, bilateral: Secondary | ICD-10-CM

## 2015-05-17 DIAGNOSIS — R030 Elevated blood-pressure reading, without diagnosis of hypertension: Secondary | ICD-10-CM

## 2015-05-17 DIAGNOSIS — Z114 Encounter for screening for human immunodeficiency virus [HIV]: Secondary | ICD-10-CM

## 2015-05-17 DIAGNOSIS — Z1321 Encounter for screening for nutritional disorder: Secondary | ICD-10-CM

## 2015-05-17 DIAGNOSIS — H6120 Impacted cerumen, unspecified ear: Secondary | ICD-10-CM | POA: Insufficient documentation

## 2015-05-17 DIAGNOSIS — Z13220 Encounter for screening for lipoid disorders: Secondary | ICD-10-CM

## 2015-05-17 NOTE — Patient Instructions (Signed)
Keeping You Healthy  Get These Tests  Blood Pressure- Have your blood pressure checked by your healthcare Maricela Schreur at least once a year.  Normal blood pressure is 120/80.  Weight- Have your body mass index (BMI) calculated to screen for obesity.  BMI is a measure of body fat based on height and weight.  You can calculate your own BMI at www.nhlbisupport.com/bmi/  Cholesterol- Have your cholesterol checked every year.  Diabetes- Have your blood sugar checked every year if you have high blood pressure, high cholesterol, a family history of diabetes or if you are overweight.  Pap Test - Have a pap test every 1 to 5 years if you have been sexually active.  If you are older than 65 and recent pap tests have been normal you may not need additional pap tests.  In addition, if you have had a hysterectomy  for benign disease additional pap tests are not necessary.  Mammogram-Yearly mammograms are essential for early detection of breast cancer  Screening for Colon Cancer- Colonoscopy starting at age 50. Screening may begin sooner depending on your family history and other health conditions.  Follow up colonoscopy as directed by your Gastroenterologist.  Screening for Osteoporosis- Screening begins at age 65 with bone density scanning, sooner if you are at higher risk for developing Osteoporosis.  Get these medicines  Calcium with Vitamin D- Your body requires 1200-1500 mg of Calcium a day and 800-1000 IU of Vitamin D a day.  You can only absorb 500 mg of Calcium at a time therefore Calcium must be taken in 2 or 3 separate doses throughout the day.  Hormones- Hormone therapy has been associated with increased risk for certain cancers and heart disease.  Talk to your healthcare Abigale Dorow about if you need relief from menopausal symptoms.  Aspirin- Ask your healthcare Signa Cheek about taking Aspirin to prevent Heart Disease and Stroke.  Get these Immuniztions  Flu shot- Every fall  Pneumonia shot-  Once after the age of 65; if you are younger ask your healthcare Durenda Pechacek if you need a pneumonia shot.  Tetanus- Every ten years.  Zostavax- Once after the age of 60 to prevent shingles.  Take these steps  Don't smoke- Your healthcare Amoreena Neubert can help you quit. For tips on how to quit, ask your healthcare Juris Gosnell or go to www.smokefree.gov or call 1-800 QUIT-NOW.  Be physically active- Exercise 5 days a week for a minimum of 30 minutes.  If you are not already physically active, start slow and gradually work up to 30 minutes of moderate physical activity.  Try walking, dancing, bike riding, swimming, etc.  Eat a healthy diet- Eat a variety of healthy foods such as fruits, vegetables, whole grains, low fat milk, low fat cheeses, yogurt, lean meats, chicken, fish, eggs, dried beans, tofu, etc.  For more information go to www.thenutritionsource.org  Dental visit- Brush and floss teeth twice daily; visit your dentist twice a year.  Eye exam- Visit your Optometrist or Ophthalmologist yearly.  Drink alcohol in moderation- Limit alcohol intake to one drink or less a day.  Never drink and drive.  Depression- Your emotional health is as important as your physical health.  If you're feeling down or losing interest in things you normally enjoy, please talk to your healthcare Julieana Eshleman.  Seat Belts- can save your life; always wear one  Smoke/Carbon Monoxide detectors- These detectors need to be installed on the appropriate level of your home.  Replace batteries at least once a year.  Violence- If   anyone is threatening or hurting you, please tell your healthcare Tramane Gorum.  Living Will/ Health care power of attorney- Discuss with your healthcare Jenya Putz and family. 

## 2015-05-17 NOTE — Progress Notes (Signed)
  Subjective:     Olivia Frank is a 64 y.o. female and is here for a comprehensive physical exam. The patient reports problems - she is under a lot of stress. her husband's mental status is failing and son is not behaving. she is working full time. .  Social History   Social History  . Marital Status: Married    Spouse Name: N/A  . Number of Children: N/A  . Years of Education: N/A   Occupational History  . Not on file.   Social History Main Topics  . Smoking status: Current Every Day Smoker -- 0.50 packs/day for 46 years    Types: Cigarettes  . Smokeless tobacco: Not on file  . Alcohol Use: No  . Drug Use: No  . Sexual Activity: Not on file   Other Topics Concern  . Not on file   Social History Narrative   Health Maintenance  Topic Date Due  . Hepatitis C Screening  12-Jun-1951  . HIV Screening  08/23/1965  . COLONOSCOPY  08/23/2000  . INFLUENZA VACCINE  03/20/2015  . MAMMOGRAM  12/07/2016  . TETANUS/TDAP  05/19/2019  . ZOSTAVAX  Completed    The following portions of the patient's history were reviewed and updated as appropriate: allergies, current medications, past family history, past medical history, past social history, past surgical history and problem list.  Review of Systems A comprehensive review of systems was negative.   Objective:    BP 155/88 mmHg  Pulse 88  Temp(Src) 97.7 F (36.5 C) (Oral)  Wt 164 lb (74.39 kg)  SpO2 95% General appearance: alert, cooperative and appears stated age Head: Normocephalic, without obvious abnormality, atraumatic Eyes: conjunctivae/corneas clear. PERRL, EOM's intact. Fundi benign. Ears: normal TM's and external ear canals both ears and normal TM's after ear lavage and cerumen removal.  Nose: Nares normal. Septum midline. Mucosa normal. No drainage or sinus tenderness. Throat: lips, mucosa, and tongue normal; teeth and gums normal Neck: no adenopathy, no carotid bruit, no JVD, supple, symmetrical, trachea midline  and thyroid not enlarged, symmetric, no tenderness/mass/nodules Back: symmetric, no curvature. ROM normal. No CVA tenderness. Lungs: clear to auscultation bilaterally Breasts: normal appearance, no masses or tenderness Heart: normal apical impulse Abdomen: soft, non-tender; bowel sounds normal; no masses,  no organomegaly Extremities: extremities normal, atraumatic, no cyanosis or edema Pulses: 2+ and symmetric Skin: Skin color, texture, turgor normal. No rashes or lesions Lymph nodes: Cervical, supraclavicular, and axillary nodes normal. Neurologic: Grossly normal    Assessment:    Healthy female exam.      Plan:    CPE/tobacco abuse- mammogram normal 11/2014. Will order colonoscopy. Taking vitamin D discussed calcium intact. Encouraged pt to quit smoking. Discussed need for DEXA. Pt would like to wait until next year for medicare to pay for it. Will get fasting labs.   Stress- discussed options of medications. Pt declined today. Will continue to monitor.   Elevated blood pressure- no recent hx. Pt is very stressed. Rechecked BP and still elevated. 2 week recheck nurse visit. If still elevated will need to start medication.   Cerumen impaction, bilateral- ear lavage done today. Discussed monthly debrox. Follow up as needed.   See After Visit Summary for Counseling Recommendations

## 2015-06-05 ENCOUNTER — Ambulatory Visit (INDEPENDENT_AMBULATORY_CARE_PROVIDER_SITE_OTHER): Payer: Managed Care, Other (non HMO) | Admitting: Physician Assistant

## 2015-06-05 VITALS — BP 128/80 | HR 64

## 2015-06-05 DIAGNOSIS — R030 Elevated blood-pressure reading, without diagnosis of hypertension: Secondary | ICD-10-CM

## 2015-06-05 DIAGNOSIS — IMO0001 Reserved for inherently not codable concepts without codable children: Secondary | ICD-10-CM

## 2015-06-05 NOTE — Progress Notes (Signed)
   Subjective:    Patient ID: Olivia Frank, female    DOB: 12/09/1950, 64 y.o.   MRN: [id number]  HPI  [name] is here for a blood pressure check.   Review of Systems     Objective:   Physical Exam        Assessment & Plan:  Blood pressure within normal limits.

## 2015-06-06 ENCOUNTER — Other Ambulatory Visit: Payer: Self-pay | Admitting: Physician Assistant

## 2015-06-06 ENCOUNTER — Encounter: Payer: Self-pay | Admitting: Physician Assistant

## 2015-06-06 DIAGNOSIS — E785 Hyperlipidemia, unspecified: Secondary | ICD-10-CM | POA: Insufficient documentation

## 2015-06-06 DIAGNOSIS — E781 Pure hyperglyceridemia: Secondary | ICD-10-CM | POA: Insufficient documentation

## 2015-06-06 LAB — COMPLETE METABOLIC PANEL WITH GFR
ALBUMIN: 4 g/dL (ref 3.6–5.1)
ALK PHOS: 77 U/L (ref 33–130)
ALT: 21 U/L (ref 6–29)
AST: 17 U/L (ref 10–35)
BILIRUBIN TOTAL: 0.4 mg/dL (ref 0.2–1.2)
BUN: 19 mg/dL (ref 7–25)
CO2: 26 mmol/L (ref 20–31)
CREATININE: 0.66 mg/dL (ref 0.50–0.99)
Calcium: 8.9 mg/dL (ref 8.6–10.4)
Chloride: 106 mmol/L (ref 98–110)
Glucose, Bld: 97 mg/dL (ref 65–99)
Potassium: 4.3 mmol/L (ref 3.5–5.3)
Sodium: 142 mmol/L (ref 135–146)
TOTAL PROTEIN: 6.3 g/dL (ref 6.1–8.1)

## 2015-06-06 LAB — LIPID PANEL
Cholesterol: 270 mg/dL — ABNORMAL HIGH (ref 125–200)
HDL: 58 mg/dL (ref >=46)
LDL CALC: 180 mg/dL — AB (ref <130)
TRIGLYCERIDES: 160 mg/dL — AB (ref <150)
Total CHOL/HDL Ratio: 4.7 Ratio (ref <=5.0)
VLDL: 32 mg/dL — ABNORMAL HIGH (ref <30)

## 2015-06-06 LAB — HIV ANTIBODY (ROUTINE TESTING W REFLEX): HIV 1&2 Ab, 4th Generation: NONREACTIVE

## 2015-06-06 LAB — HEPATITIS C ANTIBODY: HCV AB: NEGATIVE

## 2015-06-06 LAB — VITAMIN D 25 HYDROXY (VIT D DEFICIENCY, FRACTURES): Vit D, 25-Hydroxy: 78 ng/mL (ref 30–100)

## 2015-06-06 LAB — VITAMIN B12: Vitamin B-12: 632 pg/mL (ref 211–911)

## 2015-06-06 MED ORDER — ATORVASTATIN CALCIUM 40 MG PO TABS: 40.0000 mg | ORAL_TABLET | Freq: Every day | ORAL | Status: DC

## 2015-10-17 ENCOUNTER — Other Ambulatory Visit: Payer: Self-pay | Admitting: *Deleted

## 2015-10-17 MED ORDER — ATORVASTATIN CALCIUM 40 MG PO TABS
40.0000 mg | ORAL_TABLET | Freq: Every day | ORAL | Status: DC
Start: 2015-10-17 — End: 2016-06-18

## 2015-11-06 ENCOUNTER — Ambulatory Visit (INDEPENDENT_AMBULATORY_CARE_PROVIDER_SITE_OTHER): Payer: Medicare Other

## 2015-11-06 ENCOUNTER — Ambulatory Visit (INDEPENDENT_AMBULATORY_CARE_PROVIDER_SITE_OTHER): Payer: Medicare Other | Admitting: Family Medicine

## 2015-11-06 ENCOUNTER — Encounter: Payer: Self-pay | Admitting: Rehabilitative and Restorative Service Providers"

## 2015-11-06 ENCOUNTER — Encounter: Payer: Self-pay | Admitting: Family Medicine

## 2015-11-06 ENCOUNTER — Ambulatory Visit (INDEPENDENT_AMBULATORY_CARE_PROVIDER_SITE_OTHER): Payer: Medicare Other | Admitting: Rehabilitative and Restorative Service Providers"

## 2015-11-06 VITALS — BP 159/88 | HR 76 | Wt 173.0 lb

## 2015-11-06 DIAGNOSIS — M5412 Radiculopathy, cervical region: Secondary | ICD-10-CM

## 2015-11-06 DIAGNOSIS — M7918 Myalgia, other site: Secondary | ICD-10-CM

## 2015-11-06 DIAGNOSIS — M4722 Other spondylosis with radiculopathy, cervical region: Secondary | ICD-10-CM | POA: Insufficient documentation

## 2015-11-06 DIAGNOSIS — Z7409 Other reduced mobility: Secondary | ICD-10-CM

## 2015-11-06 DIAGNOSIS — M797 Fibromyalgia: Secondary | ICD-10-CM

## 2015-11-06 DIAGNOSIS — M5010 Cervical disc disorder with radiculopathy, unspecified cervical region: Secondary | ICD-10-CM

## 2015-11-06 DIAGNOSIS — R531 Weakness: Secondary | ICD-10-CM

## 2015-11-06 DIAGNOSIS — M47892 Other spondylosis, cervical region: Secondary | ICD-10-CM

## 2015-11-06 DIAGNOSIS — M50323 Other cervical disc degeneration at C6-C7 level: Secondary | ICD-10-CM | POA: Diagnosis not present

## 2015-11-06 DIAGNOSIS — M79602 Pain in left arm: Secondary | ICD-10-CM | POA: Diagnosis not present

## 2015-11-06 DIAGNOSIS — R6889 Other general symptoms and signs: Secondary | ICD-10-CM

## 2015-11-06 NOTE — Patient Instructions (Signed)
Axial Extension (Chin Tuck)    Pull chin in and lengthen back of neck. Hold _10___ seconds while counting out loud. Repeat __5-10__ times. Do _several___ sessions per day.   Flexors, Supine    Lie on back, head on small, rolled towel. Tip chin down. Tighten muscles in back of throat. Hold _10__ seconds. Repeat _5-10__ times per session. Do _several__ sessions per day.   Shoulder Blade Squeeze    Rotate shoulders back, then squeeze shoulder blades down and back . Hold 10 sec Repeat __10__ times. Do __several__ sessions per day.

## 2015-11-06 NOTE — Therapy (Signed)
[place], [place], [address] Phone: [phone]   Fax:  [phone]  Physical Therapy Evaluation  Patient Details  Name: Olivia Frank MRN: [id number] Date of Birth: [birth year] Referring Provider: Dr. Lynne Leader   Encounter Date: 11/06/2015      PT End of Session - 11/06/15 1516    Visit Number 1   Number of Visits 12   Date for PT Re-Evaluation 12/18/15   PT Start Time [id number]   PT Stop Time 1610   PT Time Calculation (min) 54 min   Activity Tolerance Patient tolerated treatment well      Past Medical History  Diagnosis Date  . Melanoma ([place])   . COPD (chronic obstructive pulmonary disease) [facility])     Past Surgical History  Procedure Laterality Date  . Appendectomy    . Ovarian cyst removal    . Abdominal hysterectomy      There were no vitals filed for this visit.  Visit Diagnosis:  Cervical radiculopathy - Plan: PT plan of care cert/re-cert  Pain of left upper extremity - Plan: PT plan of care cert/re-cert  Myofacial muscle pain - Plan: PT plan of care cert/re-cert  Decreased strength, endurance, and mobility - Plan: PT plan of care cert/re-cert      Subjective Assessment - 11/06/15 1517    Subjective Olivia Frank reports onset of Lt neck pain in the past few days. She could not get comfortable to sleep Saturday night but felt better yesterday and slept well last night. Has had recurrent pain today. Seen by MD with xrays and referral to PT.    Pertinent History some "tension" in neck and shoulders; arthritis in knees; fx Rt elbow most recently ~1 year ago.     How long can you sit comfortably? not at all   How long can you stand comfortably? not at all    How long can you walk comfortably? not at all    Diagnostic tests xrays   Patient Stated Goals get rid of the pain and keep it from returning   Currently in Pain? Yes   Pain Score 6    Pain Location Neck   Pain Orientation Left   Pain Descriptors / Indicators Sharp;Throbbing   Pain Type Acute pain   Pain Radiating Towards into the Lt arm    Pain Onset 1 to 4 weeks ago   Pain Frequency Intermittent  constant all day today   Aggravating Factors  sleeping; typing; computer work    Pain Relieving Factors heat             [facility] PT Assessment - 11/06/15 0001    Assessment   Medical Diagnosis cervical radiculopathy Lt    Referring Provider Dr. Lynne Leader    Onset Date/Surgical Date 11/02/15   Hand Dominance Right   Next MD Visit 4/17   Prior Therapy none   Precautions   Precautions None   Balance Screen   Has the patient fallen in the past 6 months No   Has the patient had a decrease in activity level because of a fear of falling?  No   Is the patient reluctant to leave their home because of a fear of falling?  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; cooking; grandchildren;  walking ~ 1 x/wk ~15 min    Observation/Other Assessments   Focus on Therapeutic Outcomes (FOTO)  49% limitation    Sensation   Additional Comments had some tingling in Lt hand middle fingers dorsal surface during the day today now resolved    Posture/Postural Control   Posture Comments head forward; shoulders rounded and elevated; head of the humerus anterior in orientation; increased thoracic kyphosis; scapulae abducted and rotated along the thoracic wall    AROM   AROM Assessment Site --  pain with all cervical movements > ext/Rt lat flex/rot    Cervical Flexion 53   Cervical Extension 34   Cervical - Right Side Bend 25   Cervical - Left Side Bend 21   Cervical - Right Rotation 30   Cervical - Left Rotation 41   Strength   Overall Strength Comments 5/5 bilat UE's    Palpation   Spinal mobility pain and tightness with CPA and UPA mobs grade II/III  upper thoracic and lower to mid cervical spine Lt > Rt    Palpation comment muscular tightness through ant/lat/post cervical musculature Lt > Rt    Special Tests    Special Tests --  cervical comp(-) distraction felt good no change in sympotms                   [facility] Adult PT Treatment/Exercise - 11/06/15 0001    Neuro Re-ed    Neuro Re-ed Details  initiated work on posture and alignment   Neck Exercises: Standing   Neck Retraction 10 secs;10 reps   Other Standing Exercises scap squeeze with noodls 10 sec x 10    Neck Exercises: Supine   Neck Retraction 10 secs;10 reps  supine with pillow    Electrical Stimulation   Electrical Stimulation Location bilat cervical; Lt cervical and trap area    Electrical Stimulation Action IFC   Electrical Stimulation Parameters to tolerance   Electrical Stimulation Goals Pain;Tone   Manual Therapy   Manual Therapy --  resolutioin of radicular symptoms with manual work    Manual therapy comments pt supine    Joint Mobilization cervical upper thoracic CPA Grade II/III mobs    Soft tissue mobilization Lt > Rt ant/lat/posterior cervical mm   Myofascial Release pecs/ant chest   Manual Traction cervical 3-4 puls for 20-40 sec                 PT Education - 11/06/15 1553    Education provided Yes   Education Details neutral spine position; HEP: TENs info    Person(s) Educated Patient   Methods Explanation;Demonstration;Tactile cues;Verbal cues;Handout   Comprehension Verbalized understanding;Returned demonstration;Verbal cues required;Tactile cues required             PT Long Term Goals - 11/06/15 1741    PT LONG TERM GOAL #1   Title Improve posture and alignment with pt to demo upright posture 12/18/15   Time 1   Period Weeks   Status New   PT LONG TERM GOAL #2   Title Increase cervica ROM to [facility] 12/18/15   Time 6   Period Weeks   Status New   PT LONG TERM GOAL #3   Title Decrease pain and radicular symptoms  allowing pt to sleep without awakending due to pain 12/18/15   Time 6   Period Weeks   Status New   PT LONG TERM GOAL #4   Title Eliminate radiculr symptoms Lt UE allowing pt to resume normal  functional activities 12/18/15   Time 6   Period Weeks   Status New   PT LONG TERM GOAL #5   Title I in HEP 12/18/15   Time 6   Period Weeks   Status New   PT LONG TERM GOAL #6   Title Improve FOTO to </= 31% limitation 12/18/15   Time 6   Period Weeks   Status New               Plan - 11/06/15 1737    Clinical Impression Statement Olivia Frank presents with signs and symptoms consistent with cervical radiculopathy with Lt UE radiculopathy. She presents with limited cervical mobility/ROM; poor posture and alignment; muscular tightness to palpation; pain and limited functional activity level. Patient will benefit from PT to address defieits identified.    Pt will benefit from skilled therapeutic intervention in order to improve on the following deficits Postural dysfunction;Improper body mechanics;Pain;Increased fascial restricitons;Decreased range of motion;Decreased mobility;Decreased endurance;Decreased activity tolerance;Increased muscle spasms   Rehab Potential Good   PT Frequency 2x / week   PT Duration 6 weeks   PT Treatment/Interventions Patient/family education;ADLs/Self Care Home Management;Neuromuscular re-education;Manual techniques;Dry needling;Therapeutic exercise;Therapeutic activities;Cryotherapy;Electrical Stimulation;Iontophoresis 4mg /ml Dexamethasone;Moist Heat;Ultrasound   PT Next Visit Plan postural correction; stretching pecs; manual work through cervical and thoracic area; modalities as indicated; continue education re - spine care and ergonomics    PT Home Exercise Plan postural correction; ergonomic suggestions; HEP; TENS info    Consulted and Agree with Plan of Care Patient         Problem List Patient Active Problem List   Diagnosis Date Noted  . Cervical disc disorder  with radiculopathy of cervical region 11/06/2015  . Hyperlipidemia 06/06/2015  . Hypertriglyceridemia 06/06/2015  . Elevated blood pressure 05/17/2015  . Stress at home 05/17/2015  . Cerumen impaction 05/17/2015  . Primary osteoarthritis of both knees 11/29/2014  . Fracture of radial head, right, closed 10/08/2013  . Left wrist sprain 10/08/2013  . Low back pain 04/14/2012  . FELON 05/27/2011  . CHRONIC OBSTRUCTIVE PULMONARY DISEASE, ACUTE 10/04/2010  . PNEUMONIA, ORGANISM UNSPECIFIED 09/20/2010  . SINUSITIS 09/22/2009  . MAMMOGRAM, ABNORMAL 07/18/2009  . HYPERLIPIDEMIA 05/18/2009  . POSTMENOPAUSAL STATUS 05/18/2009  . MALIGNANT MELANOMA 04/20/2009  . EUSTACHIAN TUBE DYSFUNCTION, RIGHT 04/20/2009    Olivia Frank Nilda Simmer PT, MPH  11/06/2015, 5:49 PM  [facility] [place], [place], [address] Phone: [phone]   Fax:  [phone]  Name: Olivia Frank MRN: [id number] Date of Birth: 1951-07-05

## 2015-11-06 NOTE — Assessment & Plan Note (Signed)
Cervical myofascial strain and spasm with likely C5 left cervical radiculopathy. X-ray pending. Plan for physical therapy. Patient declined prednisone. Recheck in one month.

## 2015-11-06 NOTE — Patient Instructions (Signed)
Thank you for coming in today. Call or go to the emergency room if you get worse, have trouble breathing, have chest pains, or palpitations.  Call or go to the ER if you develop a large red swollen joint with extreme pain or oozing puss.  Attend PT.  Use a heating pad.  TENS UNIT: This is helpful for muscle pain and spasm.   Search and Purchase a TENS 7000 2nd edition at www.tenspros.com. It should be less than $30.     TENS unit instructions: Do not shower or bathe with the unit on Turn the unit off before removing electrodes or batteries If the electrodes lose stickiness add a drop of water to the electrodes after they are disconnected from the unit and place on plastic sheet. If you continued to have difficulty, call the TENS unit company to purchase more electrodes. Do not apply lotion on the skin area prior to use. Make sure the skin is clean and dry as this will help prolong the life of the electrodes. After use, always check skin for unusual red areas, rash or other skin difficulties. If there are any skin problems, does not apply electrodes to the same area. Never remove the electrodes from the unit by pulling the wires. Do not use the TENS unit or electrodes other than as directed. Do not change electrode placement without consultating your therapist or physician. Keep 2 fingers with between each electrode. Wear time ratio is 2:1, on to off times.    For example on for 30 minutes off for 15 minutes and then on for 30 minutes off for 15 minutes   Cervical Radiculopathy Cervical radiculopathy happens when a nerve in the neck (cervical nerve) is pinched or bruised. This condition can develop because of an injury or as part of the normal aging process. Pressure on the cervical nerves can cause pain or numbness that runs from the neck all the way down into the arm and fingers. Usually, this condition gets better with rest. Treatment may be needed if the condition does not improve.    CAUSES This condition may be caused by:  Injury.  Slipped (herniated) disk.  Muscle tightness in the neck because of overuse.  Arthritis.  Breakdown or degeneration in the bones and joints of the spine (spondylosis) due to aging.  Bone spurs that may develop near the cervical nerves. SYMPTOMS Symptoms of this condition include:  Pain that runs from the neck to the arm and hand. The pain can be severe or irritating. It may be worse when the neck is moved.  Numbness or weakness in the affected arm and hand. DIAGNOSIS This condition may be diagnosed based on symptoms, medical history, and a physical exam. You may also have tests, including:  X-rays.  CT scan.  MRI.  Electromyogram (EMG).  Nerve conduction tests. TREATMENT In many cases, treatment is not needed for this condition. With rest, the condition usually gets better over time. If treatment is needed, options may include:  Wearing a soft neck collar for short periods of time.  Physical therapy to strengthen your neck muscles.  Medicines, such as NSAIDs, oral corticosteroids, or spinal injections.  Surgery. This may be needed if other treatments do not help. Various types of surgery may be done depending on the cause of your problems. HOME CARE INSTRUCTIONS Managing Pain  Take over-the-counter and prescription medicines only as told by your health care provider.  If directed, apply ice to the affected area.  Put ice in  a plastic bag.  Place a towel between your skin and the bag.  Leave the ice on for 20 minutes, 2-3 times per day.  If ice does not help, you can try using heat. Take a warm shower or warm bath, or use a heat pack as told by your health care provider.  Try a gentle neck and shoulder massage to help relieve symptoms. Activity  Rest as needed. Follow instructions from your health care provider about any restrictions on activities.  Do stretching and strengthening exercises as told by your  health care provider or physical therapist. General Instructions  If you were given a soft collar, wear it as told by your health care provider.  Use a flat pillow when you sleep.  Keep all follow-up visits as told by your health care provider. This is important. SEEK MEDICAL CARE IF:  Your condition does not improve with treatment. SEEK IMMEDIATE MEDICAL CARE IF:  Your pain gets much worse and cannot be controlled with medicines.  You have weakness or numbness in your hand, arm, face, or leg.  You have a high fever.  You have a stiff, rigid neck.  You lose control of your bowels or your bladder (have incontinence).  You have trouble with walking, balance, or speaking.   This information is not intended to replace advice given to you by your health care provider. Make sure you discuss any questions you have with your health care provider.   Document Released: 04/30/2001 Document Revised: 04/26/2015 Document Reviewed: 09/29/2014 Elsevier Interactive Patient Education [employer].

## 2015-11-06 NOTE — Progress Notes (Signed)
   Subjective:    I'm seeing this patient as a consultation for:  Olivia Planas PA  CC: Left neck pain and radiating pain to the left arm  HPI: Patient notes a several day history of left posterior neck pain radiating to the left forearm. Patient denies any weakness or numbness or loss of function. No chest pain palpitations or shortness of breath. Patient denies any specific injury. Pain is alleviated by place and the arm over the head. She's tried some over-the-counter medicines and a heating pad which have helped.  Past medical history, Surgical history, Family history not pertinant except as noted below, Social history, Allergies, and medications have been entered into the medical record, reviewed, and no changes needed.   Review of Systems: No headache, visual changes, nausea, vomiting, diarrhea, constipation, dizziness, abdominal pain, skin rash, fevers, chills, night sweats, weight loss, swollen lymph nodes, body aches, joint swelling, muscle aches, chest pain, shortness of breath, mood changes, visual or auditory hallucinations.   Objective:    Filed Vitals:   11/06/15 1411  BP: 159/88  Pulse: 76   General: Well Developed, well nourished, and in no acute distress.  Neuro/Psych: Alert and oriented x3, extra-ocular muscles intact, able to move all 4 extremities, sensation grossly intact. Skin: Warm and dry, no rashes noted.  Respiratory: Not using accessory muscles, speaking in full sentences, trachea midline.  Cardiovascular: Pulses palpable, no extremity edema. Abdomen: Does not appear distended. MSK: Neck nontender to spinal midline. Tender to palpation left trapezius and left cervical paraspinals.  Neck Range of motion  Decreased extension limited by pain Normal flexion Decreased left  rotation and left lateral flexion limited by pain. Normal right rotation and right lateral flexion Positive left-sided Spurling's test. Upper summary strength is equal and normal  throughout. Sensation is intact throughout Reflexes are equal and normal bilaterally. Pulses are intact at the wrists bilaterally  No results found for this or any previous visit (from the past 24 hour(s)). No results found.  Impression and Recommendations:   This case required medical decision making of moderate complexity.

## 2015-11-08 ENCOUNTER — Encounter: Payer: Self-pay | Admitting: Rehabilitative and Restorative Service Providers"

## 2015-11-08 ENCOUNTER — Telehealth: Payer: Self-pay

## 2015-11-08 ENCOUNTER — Ambulatory Visit (INDEPENDENT_AMBULATORY_CARE_PROVIDER_SITE_OTHER): Payer: Medicare Other | Admitting: Rehabilitative and Restorative Service Providers"

## 2015-11-08 DIAGNOSIS — M797 Fibromyalgia: Secondary | ICD-10-CM | POA: Diagnosis not present

## 2015-11-08 DIAGNOSIS — M79602 Pain in left arm: Secondary | ICD-10-CM

## 2015-11-08 DIAGNOSIS — R531 Weakness: Secondary | ICD-10-CM

## 2015-11-08 DIAGNOSIS — M7918 Myalgia, other site: Secondary | ICD-10-CM

## 2015-11-08 DIAGNOSIS — Z7409 Other reduced mobility: Secondary | ICD-10-CM

## 2015-11-08 DIAGNOSIS — M5412 Radiculopathy, cervical region: Secondary | ICD-10-CM | POA: Diagnosis present

## 2015-11-08 DIAGNOSIS — R6889 Other general symptoms and signs: Secondary | ICD-10-CM

## 2015-11-08 MED ORDER — CYCLOBENZAPRINE HCL 5 MG PO TABS: 5.0000 mg | ORAL_TABLET | Freq: Three times a day (TID) | ORAL | Status: DC | PRN

## 2015-11-08 NOTE — Telephone Encounter (Signed)
Pt called to inquire about an injection for the cervical disc disorder with radiculopathy of cervical region that we recently saw her for. Please advise.

## 2015-11-08 NOTE — Telephone Encounter (Signed)
Flexeril sent in

## 2015-11-08 NOTE — Therapy (Signed)
[place], [place], [address] Phone: [phone]   Fax:  [phone]  Physical Therapy Treatment  Patient Details  Name: Olivia Frank MRN: [id number] Date of Birth: 18-Sep-1950 Referring Provider: Dr. Lynne Leader   Encounter Date: 11/08/2015      PT End of Session - 11/08/15 1405    Visit Number 2   Number of Visits 12   Date for PT Re-Evaluation 12/18/15   PT Start Time [id number]   PT Stop Time 1450   PT Time Calculation (min) 48 min   Activity Tolerance Patient tolerated treatment well      Past Medical History  Diagnosis Date  . Melanoma ([place])   . COPD (chronic obstructive pulmonary disease) [facility])     Past Surgical History  Procedure Laterality Date  . Appendectomy    . Ovarian cyst removal    . Abdominal hysterectomy      There were no vitals filed for this visit.  Visit Diagnosis:  Cervical radiculopathy  Pain of left upper extremity  Myofacial muscle pain  Decreased strength, endurance, and mobility      Subjective Assessment - 11/08/15 1405    Subjective Patient reports that she good times during the day and bad times. Symptoms are worse at work and when driving. She has a TENS unit which helps some. She is sleeping ok now.    Currently in Pain? Yes   Pain Score 8    Pain Location Neck   Pain Orientation Left   Pain Descriptors / Indicators Sharp   Pain Type Acute pain   Pain Onset 1 to 4 weeks ago   Pain Frequency Intermittent                         [facility] Adult PT Treatment/Exercise - 11/08/15 0001    Neck Exercises: Standing   Neck Retraction 10 secs;10 reps   Other Standing Exercises scap squeeze with noodls 10 sec x 10    Neck Exercises: Supine   Neck Retraction 10 secs;10 reps  supine with pillow    Electrical Stimulation   Electrical Stimulation Location bilat cervical; Lt cervical and trap area    Electrical Stimulation Action IFC    Electrical Stimulation Parameters to tolerance    Electrical Stimulation Goals Pain;Tone   Manual Therapy   Manual Therapy --  resolutioin of radicular symptoms with manual work    Manual therapy comments pt supine    Joint Mobilization cervical upper thoracic CPA Grade II/III mobs    Soft tissue mobilization Lt > Rt ant/lat/posterior cervical mm   Myofascial Release pecs/ant chest   Manual Traction cervical 3-4 puls for 20-40 sec    Neck Exercises: Stretches   Other Neck Stretches doorway x 30 x 3                 PT Education - 11/08/15 1411    Education provided Yes   Education Details postue and alignment; activity level; HEP    Person(s) Educated Patient   Methods Explanation;Demonstration;Tactile cues;Verbal cues;Handout   Comprehension Verbalized understanding;Returned demonstration;Verbal cues required;Tactile cues required             PT Long Term Goals - 11/08/15 1412    PT LONG TERM GOAL #1   Title Improve posture and alignment with pt to demo upright posture 12/18/15   Time 1   Period Weeks   Status On-going   PT LONG  TERM GOAL #2   Title Increase cervical ROM to [facility] 12/18/15   Time 6   Period Weeks   Status On-going   PT LONG TERM GOAL #3   Title Decrease pain and radicular symptoms allowing pt to sleep without awakending due to pain 12/18/15   Time 6   Period Weeks   Status On-going   PT LONG TERM GOAL #4   Title Eliminate radicuar symptoms Lt UE allowing pt to resume normal functional activities 12/18/15   Time 6   Period Weeks   Status On-going   PT LONG TERM GOAL #5   Title I in HEP 12/18/15   Time 6   Period Weeks   Status On-going   PT LONG TERM GOAL #6   Title Improve FOTO to </= 31% limitation 12/18/15   Time 6   Period Weeks   Status On-going               Plan - 11/08/15 1413    Clinical Impression Statement Continued symptoms but more intermittent in neture. Pt is sleeping well but having sympotms during the day. Continued  poor postue and alignment; muscular tightness. Some progress toward goals of PT - only 2nd visit.    Pt will benefit from skilled therapeutic intervention in order to improve on the following deficits Postural dysfunction;Improper body mechanics;Pain;Increased fascial restricitons;Decreased range of motion;Decreased mobility;Decreased endurance;Decreased activity tolerance;Increased muscle spasms   Rehab Potential Good   PT Frequency 2x / week   PT Duration 6 weeks   PT Treatment/Interventions Patient/family education;ADLs/Self Care Home Management;Neuromuscular re-education;Manual techniques;Dry needling;Therapeutic exercise;Therapeutic activities;Cryotherapy;Electrical Stimulation;Iontophoresis 4mg /ml Dexamethasone;Moist Heat;Ultrasound   PT Next Visit Plan postural correction; stretching pecs; manual work through cervical and thoracic area; modalities as indicated; continue education re - spine care and ergonomics    PT Home Exercise Plan postural correction; ergonomic suggestions; HEP; TENS info    Consulted and Agree with Plan of Care Patient        Problem List Patient Active Problem List   Diagnosis Date Noted  . Cervical disc disorder with radiculopathy of cervical region 11/06/2015  . Hyperlipidemia 06/06/2015  . Hypertriglyceridemia 06/06/2015  . Elevated blood pressure 05/17/2015  . Stress at home 05/17/2015  . Cerumen impaction 05/17/2015  . Primary osteoarthritis of both knees 11/29/2014  . Fracture of radial head, right, closed 10/08/2013  . Left wrist sprain 10/08/2013  . Low back pain 04/14/2012  . FELON 05/27/2011  . CHRONIC OBSTRUCTIVE PULMONARY DISEASE, ACUTE 10/04/2010  . PNEUMONIA, ORGANISM UNSPECIFIED 09/20/2010  . SINUSITIS 09/22/2009  . MAMMOGRAM, ABNORMAL 07/18/2009  . HYPERLIPIDEMIA 05/18/2009  . POSTMENOPAUSAL STATUS 05/18/2009  . MALIGNANT MELANOMA 04/20/2009  . EUSTACHIAN TUBE DYSFUNCTION, RIGHT 04/20/2009    Olivia Frank Olivia Frank PT, MPH 11/08/2015, 2:43  PM  [facility] [place], [place], [address] Phone: [phone]   Fax:  [phone]  Name: Olivia Frank MRN: [id number] Date of Birth: May 15, 1951

## 2015-11-08 NOTE — Telephone Encounter (Signed)
Pt would like to hold off on a MRI for not and continue PT. She would like to know if a muscle relaxer would be beneficial in relieving her discomfort. If appropriate she would like this sent to the [facility] on [address]. [phone] (Fax)

## 2015-11-08 NOTE — Patient Instructions (Signed)
Scapula Adduction With Pectoralis Stretch: Low - Standing   Shoulders at 45 hands even with shoulders, keeping weight through legs, shift weight forward until you feel pull or stretch through the front of your chest. Hold _30__ seconds. Do _3__ times, _2-4__ times per day.   Scapula Adduction With Pectoralis Stretch: Mid-Range - Standing   Shoulders at 90 elbows even with shoulders, keeping weight through legs, shift weight forward until you feel pull or strength through the front of your chest. Hold __30_ seconds. Do _3__ times, __2-4_ times per day.   Scapula Adduction With Pectoralis Stretch: High - Standing   Shoulders at 120 hands up high on the doorway, keeping weight on feet, shift weight forward until you feel pull or stretch through the front of your chest. Hold _30__ seconds. Do _3__ times, _2-3__ times per day.  

## 2015-11-08 NOTE — Telephone Encounter (Signed)
Epidural steroid injection is step 3 or 4.  We would have to do a MRI and typically fail physical therapy first.  If symptoms are worsening we will order a MRI.

## 2015-11-13 ENCOUNTER — Ambulatory Visit (INDEPENDENT_AMBULATORY_CARE_PROVIDER_SITE_OTHER): Payer: Medicare Other | Admitting: Family Medicine

## 2015-11-13 ENCOUNTER — Encounter: Payer: Self-pay | Admitting: Family Medicine

## 2015-11-13 ENCOUNTER — Ambulatory Visit (INDEPENDENT_AMBULATORY_CARE_PROVIDER_SITE_OTHER): Payer: Medicare Other

## 2015-11-13 VITALS — BP 160/93 | HR 108 | Wt 170.0 lb

## 2015-11-13 DIAGNOSIS — M4802 Spinal stenosis, cervical region: Secondary | ICD-10-CM

## 2015-11-13 DIAGNOSIS — M5010 Cervical disc disorder with radiculopathy, unspecified cervical region: Secondary | ICD-10-CM | POA: Diagnosis not present

## 2015-11-13 DIAGNOSIS — M542 Cervicalgia: Secondary | ICD-10-CM | POA: Diagnosis not present

## 2015-11-13 MED ORDER — GABAPENTIN 300 MG PO CAPS: ORAL_CAPSULE | ORAL | Status: DC

## 2015-11-13 NOTE — Patient Instructions (Signed)
Thank you for coming in today. Return Wednesday to go over MRI.  Start gabapentin at night. Advance to three times daily.  Stop cyclobenzaprine.  Come back or go to the emergency room if you notice new weakness new numbness problems walking or bowel or bladder problems.

## 2015-11-13 NOTE — Progress Notes (Signed)
       Olivia Frank is a 65 y.o. female who presents to [facility] today for follow-up left cervical radiculopathy. Patient was recently seen for neck pain radiating to her left arm. This is thought to be due to left C5 cervical radiculopathy. She's had physical therapy which has not made a difference. Her symptoms are continuing and are quite bothersome and interfering with sleep. She had cyclobenzaprine does not help at all. She notes that she does not tolerate oral prednisone very well at all and would like to proceed with an epidural steroid injection if possible. She denies any new weakness or numbness.   Past Medical History  Diagnosis Date  . Melanoma ([place])   . COPD (chronic obstructive pulmonary disease) [facility])    Past Surgical History  Procedure Laterality Date  . Appendectomy    . Ovarian cyst removal    . Abdominal hysterectomy     Social History  Substance Use Topics  . Smoking status: Current Every Day Smoker -- 0.50 packs/day for 46 years    Types: Cigarettes  . Smokeless tobacco: Not on file  . Alcohol Use: No   family history includes Cancer in her maternal grandfather, maternal grandmother, mother, and sister; Heart disease in her father; Hyperlipidemia in her mother; Seizures in her sister.  ROS as above Medications: Current Outpatient Prescriptions  Medication Sig Dispense Refill  . atorvastatin (LIPITOR) 40 MG tablet Take 1 tablet (40 mg total) by mouth daily. 90 tablet 1  . cyclobenzaprine (FLEXERIL) 5 MG tablet Take 1-2 tablets (5-10 mg total) by mouth 3 (three) times daily as needed for muscle spasms. 30 tablet 1  . vitamin B-12 (CYANOCOBALAMIN) 1000 MCG tablet Take 1,000 mcg by mouth daily.    [name] VITAMIN D, CHOLECALCIFEROL, PO Take 1,000 Units by mouth daily.    [name] gabapentin (NEURONTIN) 300 MG capsule One tab PO qHS for a week, then BID for a week, then TID.  May double weekly to a max of 3,600mg /day 180 capsule 3   No current facility-administered medications for this visit.   Allergies  Allergen Reactions  . Influenza Vaccines     Nausea and vomiting after shots. Per pt does not want to take flu shot.   . Morphine And Related Hives    itching     Exam:  BP 160/93 mmHg  Pulse 108  Wt 170 lb (77.111 kg) Gen: Well NAD MSK: Neck is nontender. Decreased motion due to pain. Left upper extremity strength is equal and normal throughout.  X-ray C-spine dated 11/06/2015 reviewed  No results found for this or any previous visit (from the past 24 hour(s)). No results found.   Please see individual assessment and plan sections.

## 2015-11-13 NOTE — Assessment & Plan Note (Signed)
With existing DDD on x-ray. Plan for MRI of C-spine today for epidural steroid injection planning. Continue PT. Start gabapentin. Check following MRI.

## 2015-11-14 NOTE — Progress Notes (Signed)
Quick Note:  MRI does show some impingement. I have ordered the injection. The order should go to somewhere in [place] if able. ______

## 2015-11-15 ENCOUNTER — Ambulatory Visit: Payer: Medicare Other | Admitting: Family Medicine

## 2015-11-15 ENCOUNTER — Ambulatory Visit (INDEPENDENT_AMBULATORY_CARE_PROVIDER_SITE_OTHER): Payer: Medicare Other | Admitting: Physical Therapy

## 2015-11-15 DIAGNOSIS — R531 Weakness: Secondary | ICD-10-CM

## 2015-11-15 DIAGNOSIS — R6889 Other general symptoms and signs: Secondary | ICD-10-CM

## 2015-11-15 DIAGNOSIS — M5412 Radiculopathy, cervical region: Secondary | ICD-10-CM | POA: Diagnosis present

## 2015-11-15 DIAGNOSIS — Z7409 Other reduced mobility: Secondary | ICD-10-CM | POA: Diagnosis not present

## 2015-11-15 DIAGNOSIS — M79602 Pain in left arm: Secondary | ICD-10-CM

## 2015-11-15 DIAGNOSIS — M797 Fibromyalgia: Secondary | ICD-10-CM | POA: Diagnosis not present

## 2015-11-15 DIAGNOSIS — M7918 Myalgia, other site: Secondary | ICD-10-CM

## 2015-11-15 NOTE — Therapy (Addendum)
[place], [place], [address] Phone: [phone]   Fax:  [phone]  Physical Therapy Treatment  Patient Details  Name: Olivia Frank MRN: [id number] Date of Birth: [birth year] Referring Provider: Dr. Georgina Snell   Encounter Date: 11/15/2015      PT End of Session - 11/15/15 0808    Visit Number 3   Number of Visits 12   Date for PT Re-Evaluation 12/18/15   PT Start Time 0803   PT Stop Time 0843   PT Time Calculation (min) 40 min      Past Medical History  Diagnosis Date  . Melanoma ([place])   . COPD (chronic obstructive pulmonary disease) [facility])     Past Surgical History  Procedure Laterality Date  . Appendectomy    . Ovarian cyst removal    . Abdominal hysterectomy      There were no vitals filed for this visit.  Visit Diagnosis:  Cervical radiculopathy  Pain of left upper extremity  Myofacial muscle pain  Decreased strength, endurance, and mobility      Subjective Assessment - 11/15/15 0829    Subjective Pt reports her hot shower this morning helped ease her pain in neck.    Currently in Pain? Yes   Pain Score 5    Pain Location Neck   Pain Orientation Left   Pain Radiating Towards into Lt shoulder            [facility] PT Assessment - 11/15/15 0001    Assessment   Medical Diagnosis cervical radiculopathy Lt    Referring Provider Dr. Georgina Snell    Onset Date/Surgical Date 11/02/15   Hand Dominance Right   Next MD Visit 4/17          [facility] Adult PT Treatment/Exercise - 11/15/15 0001    Neck Exercises: Standing   Neck Retraction 10 reps;3 secs  axial ext, chin tuck   Other Standing Exercises scap squeeze with noodle 5 sec x 10    Other Standing Exercises Doorway 3 position x 30 sec    Neck Exercises: Seated   Other Seated Exercise Lateral neck flexion stretch each side x 15 sec x 2 reps    Neck Exercises: Supine   Upper Extremity D2 Flexion;10 reps;Theraband   Theraband  Level (UE D2) Level 1 (Yellow)   Other Supine Exercise bilat shoulder horiz abd with yellow band x 10; bilat shoulder ER with yellow band x 10;  bilat shoulder flex with yellow band x 10                       Manual Therapy   Manual therapy comments pt supine    Soft tissue mobilization Lt/ Rt cervical paraspinals, upper trap, upper thoracic paraspinals.    Myofascial Release suboccipital release.    Manual Traction cervical 3-4 puls for 20-40 sec           PT Long Term Goals - 11/08/15 1412    PT LONG TERM GOAL #1   Title Improve posture and alignment with pt to demo upright posture 12/18/15   Time 1   Period Weeks   Status On-going   PT LONG TERM GOAL #2   Title Increase cervical ROM to [facility] 12/18/15   Time 6   Period Weeks   Status On-going   PT LONG TERM GOAL #3   Title Decrease pain and radicular symptoms allowing pt to sleep without awakending due to pain 12/18/15  Time 6   Period Weeks   Status On-going   PT LONG TERM GOAL #4   Title Eliminate radicuar symptoms Lt UE allowing pt to resume normal functional activities 12/18/15   Time 6   Period Weeks   Status On-going   PT LONG TERM GOAL #5   Title I in HEP 12/18/15   Time 6   Period Weeks   Status On-going   PT LONG TERM GOAL #6   Title Improve FOTO to </= 31% limitation 12/18/15   Time 6   Period Weeks   Status On-going               Plan - 11/15/15 0845    Clinical Impression Statement Pt has had positive response to last 2 sessions; reporting overall decrease in pain.  Pt tolerated new exercises with slight reduction in pain.  Pt declined use of modalities as she has heat and TENS unit she will use once at work.  Progressing towards goals.    Pt will benefit from skilled therapeutic intervention in order to improve on the following deficits Postural dysfunction;Improper body mechanics;Pain;Increased fascial restricitons;Decreased range of motion;Decreased mobility;Decreased endurance;Decreased activity  tolerance;Increased muscle spasms   Rehab Potential Good   PT Frequency 2x / week   PT Duration 6 weeks   PT Treatment/Interventions Patient/family education;ADLs/Self Care Home Management;Neuromuscular re-education;Manual techniques;Dry needling;Therapeutic exercise;Therapeutic activities;Cryotherapy;Electrical Stimulation;Iontophoresis 4mg /ml Dexamethasone;Moist Heat;Ultrasound   PT Next Visit Plan postural correction; stretching pecs; manual work through cervical and thoracic area; modalities as indicated; continue education re - spine care and ergonomics    PT Home Exercise Plan Pt to adjust computer monitor at work and try lumbar support in car and work desk.    Consulted and Agree with Plan of Care Patient        Problem List Patient Active Problem List   Diagnosis Date Noted  . Cervical disc disorder with radiculopathy of cervical region 11/06/2015  . Hyperlipidemia 06/06/2015  . Hypertriglyceridemia 06/06/2015  . Elevated blood pressure 05/17/2015  . Stress at home 05/17/2015  . Cerumen impaction 05/17/2015  . Primary osteoarthritis of both knees 11/29/2014  . Fracture of radial head, right, closed 10/08/2013  . Left wrist sprain 10/08/2013  . Low back pain 04/14/2012  . FELON 05/27/2011  . CHRONIC OBSTRUCTIVE PULMONARY DISEASE, ACUTE 10/04/2010  . PNEUMONIA, ORGANISM UNSPECIFIED 09/20/2010  . SINUSITIS 09/22/2009  . MAMMOGRAM, ABNORMAL 07/18/2009  . HYPERLIPIDEMIA 05/18/2009  . POSTMENOPAUSAL STATUS 05/18/2009  . MALIGNANT MELANOMA 04/20/2009  . EUSTACHIAN TUBE DYSFUNCTION, RIGHT 04/20/2009   Kerin Perna, PTA 11/15/2015 8:58 AM [facility] [place], [place], [address] Phone: [phone]   Fax:  [phone]  Name: Olivia Frank MRN: [id number] Date of Birth: 1951/01/21

## 2015-11-15 NOTE — Patient Instructions (Signed)
Over Head Pull: Narrow Grip     [place] [phone]   On back, knees bent, feet flat, band across thighs, elbows straight but relaxed. Pull hands apart (start). Keeping elbows straight, bring arms up and over head, hands toward floor. Keep pull steady on band. Hold momentarily. Return slowly, keeping pull steady, back to start. Repeat __10_ times. Band color __yellow____   Side Pull: Double Arm   On back, knees bent, feet flat. Arms perpendicular to body, shoulder level, elbows straight but relaxed. Pull arms out to sides, elbows straight. Resistance band comes across collarbones, hands toward floor. Hold momentarily. Slowly return to starting position. Repeat _10__ times. Band color __yellow___   Sash   On back, knees bent, feet flat, left hand on left hip, right hand above left. Pull right arm DIAGONALLY (hip to shoulder) across chest. Bring right arm along head toward floor. Hold momentarily. Slowly return to starting position. Repeat __10_ times. Do with left arm. Band color __yellow____   Shoulder Rotation: Double Arm   On back, knees bent, feet flat, elbows tucked at sides, bent 90, hands palms up. Pull hands apart and down toward floor, keeping elbows near sides. Hold momentarily. Slowly return to starting position. Repeat __10_ times. Band color ___yellow ___    [facility] [place], [place] [address]  [phone] (office) [phone] (fax)

## 2015-11-17 ENCOUNTER — Ambulatory Visit (INDEPENDENT_AMBULATORY_CARE_PROVIDER_SITE_OTHER): Payer: Medicare Other | Admitting: Physical Therapy

## 2015-11-17 DIAGNOSIS — M79602 Pain in left arm: Secondary | ICD-10-CM

## 2015-11-17 DIAGNOSIS — M797 Fibromyalgia: Secondary | ICD-10-CM

## 2015-11-17 DIAGNOSIS — M7918 Myalgia, other site: Secondary | ICD-10-CM

## 2015-11-17 DIAGNOSIS — Z7409 Other reduced mobility: Secondary | ICD-10-CM

## 2015-11-17 DIAGNOSIS — R6889 Other general symptoms and signs: Secondary | ICD-10-CM

## 2015-11-17 DIAGNOSIS — R531 Weakness: Secondary | ICD-10-CM

## 2015-11-17 DIAGNOSIS — M5412 Radiculopathy, cervical region: Secondary | ICD-10-CM | POA: Diagnosis present

## 2015-11-17 NOTE — Therapy (Signed)
[place], [place], [address] Phone: [phone]   Fax:  [phone]  Physical Therapy Treatment  Patient Details  Name: Olivia Frank MRN: [id number] Date of Birth: May 05, 1951 Referring Provider: Dr. Georgina Snell  Encounter Date: 11/17/2015      PT End of Session - 11/17/15 1158    Visit Number 4   Number of Visits 12   Date for PT Re-Evaluation 12/18/15   PT Start Time [id number]   PT Stop Time 1233   PT Time Calculation (min) 45 min   Activity Tolerance Patient tolerated treatment well;No increased pain      Past Medical History  Diagnosis Date  . Melanoma ([place])   . COPD (chronic obstructive pulmonary disease) [facility])     Past Surgical History  Procedure Laterality Date  . Appendectomy    . Ovarian cyst removal    . Abdominal hysterectomy      There were no vitals filed for this visit.  Visit Diagnosis:  Cervical radiculopathy  Pain of left upper extremity  Myofacial muscle pain  Decreased strength, endurance, and mobility      Subjective Assessment - 11/17/15 1151    Subjective Pt states her neck is less bothersome, and reports she didn't sleep well last; Lt shoulder kept waking her due to pain.  She has used a pillow in lumbar area for support and this has helped. Pt using heat and TENS for pain relief.    Currently in Pain? Yes   Pain Score 6    Pain Location Shoulder   Pain Orientation Left   Pain Descriptors / Indicators Throbbing;Aching   Aggravating Factors  sleeping, computer work   Pain Relieving Factors heat, TENS            [facility] PT Assessment - 11/17/15 0001    Assessment   Medical Diagnosis cervical radiculopathy Lt    Referring Provider Dr. Georgina Snell   Onset Date/Surgical Date 11/02/15   Hand Dominance Right   Next MD Visit 12-06-15          [facility] Adult PT Treatment/Exercise - 11/17/15 0001    Neck Exercises: Seated   Other Seated Exercise Lateral neck flexion  stretch each side x 15 sec x 2 reps    Neck Exercises: Supine   Neck Retraction 10 secs;10 reps  supine with pillow    Upper Extremity D2 Flexion;10 reps;Theraband   Theraband Level (UE D2) Level 1 (Yellow)   Other Supine Exercise bilat shoulder horiz abd with yellow band x 10; bilat shoulder ER with yellow band x 10;  bilat shoulder flex with yellow band x 10    Other Supine Exercise snow angels with pause 10 sec at ~115 deg abd x 10 reps   Modalities   Modalities --  pt declined   Manual Therapy   Manual therapy comments pt supine    Soft tissue mobilization Lt/ Rt cervical paraspinals, upper trap, upper thoracic paraspinals. Lt scap lateral traction with TPR to rhomboid.    Myofascial Release suboccipital release, Lt pec release, platysma on Lt.    Manual Traction cervical 3-4 puls for 20-40 sec    Neck Exercises: Stretches   Other Neck Stretches doorway x 30 x 3 (unilateral for high position)                PT Education - 11/17/15 1229    Education provided Yes   Education Details HEP    Person(s) Educated Patient  Methods Explanation;Handout   Comprehension Verbalized understanding;Returned demonstration             PT Long Term Goals - 11/08/15 1412    PT LONG TERM GOAL #1   Title Improve posture and alignment with pt to demo upright posture 12/18/15   Time 1   Period Weeks   Status On-going   PT LONG TERM GOAL #2   Title Increase cervical ROM to [facility] 12/18/15   Time 6   Period Weeks   Status On-going   PT LONG TERM GOAL #3   Title Decrease pain and radicular symptoms allowing pt to sleep without awakending due to pain 12/18/15   Time 6   Period Weeks   Status On-going   PT LONG TERM GOAL #4   Title Eliminate radicuar symptoms Lt UE allowing pt to resume normal functional activities 12/18/15   Time 6   Period Weeks   Status On-going   PT LONG TERM GOAL #5   Title I in HEP 12/18/15   Time 6   Period Weeks   Status On-going   PT LONG TERM GOAL #6    Title Improve FOTO to </= 31% limitation 12/18/15   Time 6   Period Weeks   Status On-going               Plan - 11/17/15 1226    Clinical Impression Statement Pt reported reduced pain by 5 points in Lt shoulder after ther ex and manual therapy.  Progressing towards goals.    Pt will benefit from skilled therapeutic intervention in order to improve on the following deficits Postural dysfunction;Improper body mechanics;Pain;Increased fascial restricitons;Decreased range of motion;Decreased mobility;Decreased endurance;Decreased activity tolerance;Increased muscle spasms   Rehab Potential Good   PT Frequency 2x / week   PT Duration 6 weeks   PT Treatment/Interventions Patient/family education;ADLs/Self Care Home Management;Neuromuscular re-education;Manual techniques;Dry needling;Therapeutic exercise;Therapeutic activities;Cryotherapy;Electrical Stimulation;Iontophoresis 4mg /ml Dexamethasone;Moist Heat;Ultrasound   PT Next Visit Plan postural correction; stretching pecs; manual work through cervical and thoracic area; modalities as indicated; continue education re - spine care and ergonomics    Consulted and Agree with Plan of Care Patient        Problem List Patient Active Problem List   Diagnosis Date Noted  . Cervical disc disorder with radiculopathy of cervical region 11/06/2015  . Hyperlipidemia 06/06/2015  . Hypertriglyceridemia 06/06/2015  . Elevated blood pressure 05/17/2015  . Stress at home 05/17/2015  . Cerumen impaction 05/17/2015  . Primary osteoarthritis of both knees 11/29/2014  . Fracture of radial head, right, closed 10/08/2013  . Left wrist sprain 10/08/2013  . Low back pain 04/14/2012  . FELON 05/27/2011  . CHRONIC OBSTRUCTIVE PULMONARY DISEASE, ACUTE 10/04/2010  . PNEUMONIA, ORGANISM UNSPECIFIED 09/20/2010  . SINUSITIS 09/22/2009  . MAMMOGRAM, ABNORMAL 07/18/2009  . HYPERLIPIDEMIA 05/18/2009  . POSTMENOPAUSAL STATUS 05/18/2009  . MALIGNANT MELANOMA  04/20/2009  . EUSTACHIAN TUBE DYSFUNCTION, RIGHT 04/20/2009   Kerin Perna, PTA 11/17/2015 12:38 PM  [place], [place], [address] Phone: [phone]   Fax:  [phone]  Name: Olivia Frank MRN: [id number] Date of Birth: 10-Jan-1951

## 2015-11-17 NOTE — Patient Instructions (Signed)
[place]: Double Arm    Arms near sides, palms up. Press both arms lightly into floor, slide arms out to side and up alongside head. Keep contact with floor throughout motion. At maximal position, lengthen arms. Hold _10__ seconds. Relax. Slide arms back to start. Repeat _10__ times.  [facility] at [facility] [place], [place] [address]  [phone] (office) [phone] (fax)

## 2015-11-21 ENCOUNTER — Ambulatory Visit (INDEPENDENT_AMBULATORY_CARE_PROVIDER_SITE_OTHER): Payer: Medicare Other | Admitting: Physical Therapy

## 2015-11-21 DIAGNOSIS — M5412 Radiculopathy, cervical region: Secondary | ICD-10-CM

## 2015-11-21 DIAGNOSIS — Z7409 Other reduced mobility: Secondary | ICD-10-CM | POA: Diagnosis not present

## 2015-11-21 DIAGNOSIS — M79602 Pain in left arm: Secondary | ICD-10-CM | POA: Diagnosis not present

## 2015-11-21 DIAGNOSIS — M797 Fibromyalgia: Secondary | ICD-10-CM | POA: Diagnosis not present

## 2015-11-21 DIAGNOSIS — R531 Weakness: Secondary | ICD-10-CM

## 2015-11-21 DIAGNOSIS — M7918 Myalgia, other site: Secondary | ICD-10-CM

## 2015-11-21 NOTE — Patient Instructions (Signed)
  Low Row: Standing   Face anchor, feet shoulder width apart. Palms up, pull arms back, squeezing shoulder blades together. Repeat 10__ times per set. Do 2-3__ sets per session. Do 2-3__ sessions per week. Anchor Height: Waist     Strengthening: Resisted Extension   Hold tubing in right hand, arm forward. Pull arm back, elbow straight. Repeat _10___ times per set. Do 2-3____ sets per session. Do 2-3____ sessions per week.   [facility] at [facility] [place], [place] [address]  [phone] (office) [phone] (fax)

## 2015-11-21 NOTE — Therapy (Signed)
[place], [place], [address] Phone: [phone]   Fax:  [phone]  Physical Therapy Treatment  Patient Details  Name: Olivia Frank MRN: [id number] Date of Birth: 1950/08/25 Referring Provider: Dr. Georgina Snell   Encounter Date: 11/21/2015      PT End of Session - 11/21/15 1153    Visit Number 5   Number of Visits 12   Date for PT Re-Evaluation 12/18/15   PT Start Time 9485   PT Stop Time 1230   PT Time Calculation (min) 42 min   Activity Tolerance Patient tolerated treatment well;No increased pain      Past Medical History  Diagnosis Date  . Melanoma ([place])   . COPD (chronic obstructive pulmonary disease) [facility])     Past Surgical History  Procedure Laterality Date  . Appendectomy    . Ovarian cyst removal    . Abdominal hysterectomy      There were no vitals filed for this visit.  Visit Diagnosis:  Cervical radiculopathy  Pain of left upper extremity  Myofacial muscle pain  Decreased strength, endurance, and mobility      Subjective Assessment - 11/21/15 1150    Subjective Pt is sleeping well every other night.  She has changed her computer monitor and work chair and this has helped. HEP going well.    Currently in Pain? Yes   Pain Score 3    Pain Location Neck   Pain Orientation Left   Pain Descriptors / Indicators Sore   Pain Radiating Towards into Lt shoulder    Aggravating Factors  sleeping    Pain Relieving Factors heat, TENS            [facility] PT Assessment - 11/21/15 0001    Assessment   Medical Diagnosis cervical radiculopathy Lt    Referring Provider Dr. Georgina Snell    Onset Date/Surgical Date 11/02/15   Hand Dominance Right   Next MD Visit 12-06-15   AROM   Cervical Flexion 53   Cervical Extension 42   Cervical - Right Side Bend 31   Cervical - Left Side Bend 36   Cervical - Right Rotation 65   Cervical - Left Rotation 67          [facility] Adult PT  Treatment/Exercise - 11/21/15 0001    Neck Exercises: Machines for Strengthening   UBE (Upper Arm Bike) L2: 1.5 min each direction    Neck Exercises: Standing   Other Standing Exercises Rowing with red band x 10 with BUE; Bilat shoulder ext with red band x 10    Neck Exercises: Seated   Other Seated Exercise Lateral neck flexion stretch each side x 15 sec x 2 reps    Neck Exercises: Supine   Upper Extremity D2 Flexion;10 reps;Theraband   Theraband Level (UE D2) Level 2 (Red)   Other Supine Exercise bilat shoulder horiz abd with red band x 10; bilat shoulder ER with red band x 10;  bilat shoulder flex with red band x 10 - 2 sets of each    Other Supine Exercise snow angels with pause 10 sec at ~115 deg abd x 10 reps   Manual Therapy   Manual therapy comments pt supine    Soft tissue mobilization Lt/ Rt cervical paraspinals, upper trap, upper thoracic paraspinals. Lt scap lateral traction with TPR to rhomboid.    Myofascial Release suboccipital release, Lt pec release, platysma on Lt.    Manual Traction cervical 3-4 puls for  20-40 sec                 PT Education - 11/21/15 1214    Education provided Yes   Education Details HEP; issued red band for existing HEP    Person(s) Educated Patient   Methods Handout;Explanation;Demonstration   Comprehension Verbalized understanding;Returned demonstration             PT Long Term Goals - 11/21/15 1215    PT LONG TERM GOAL #1   Title Improve posture and alignment with pt to demo upright posture 12/18/15   Time 6   Period Weeks   Status Achieved   PT LONG TERM GOAL #2   Title Increase cervical ROM to [facility] 12/18/15   Time 6   Period Weeks   Status Partially Met   PT LONG TERM GOAL #3   Title Decrease pain and radicular symptoms allowing pt to sleep without awakending due to pain 12/18/15   Time 6   Period Weeks   Status On-going   PT LONG TERM GOAL #4   Title Eliminate radicuar symptoms Lt UE allowing pt to resume normal  functional activities 12/18/15   Time 6   Period Weeks   PT LONG TERM GOAL #5   Title I in HEP 12/18/15   Time 6   Period Weeks   Status On-going   PT LONG TERM GOAL #6   Title Improve FOTO to </= 31% limitation 12/18/15   Time 6   Period Weeks   Status On-going               Plan - 11/21/15 1235    Clinical Impression Statement Pt tolerated increased resistance with ther ex without increase in pain. Pt demonstrated improved cervical ROM. Pt reported decrease in soreness at end of session; declined use of modalities as she will use TENS at work if needed. Pt has met LTG #1, partially met LTG #2.      Pt will benefit from skilled therapeutic intervention in order to improve on the following deficits Postural dysfunction;Improper body mechanics;Pain;Increased fascial restricitons;Decreased range of motion;Decreased mobility;Decreased endurance;Decreased activity tolerance;Increased muscle spasms   Rehab Potential Good   PT Frequency 2x / week   PT Duration 6 weeks   PT Treatment/Interventions Patient/family education;ADLs/Self Care Home Management;Neuromuscular re-education;Manual techniques;Dry needling;Therapeutic exercise;Therapeutic activities;Cryotherapy;Electrical Stimulation;Iontophoresis 67m/ml Dexamethasone;Moist Heat;Ultrasound   PT Next Visit Plan postural correction; stretching pecs; manual work through cervical and thoracic area; modalities as indicated; continue education re - spine care and ergonomics    Consulted and Agree with Plan of Care Patient        Problem List Patient Active Problem List   Diagnosis Date Noted  . Cervical disc disorder with radiculopathy of cervical region 11/06/2015  . Hyperlipidemia 06/06/2015  . Hypertriglyceridemia 06/06/2015  . Elevated blood pressure 05/17/2015  . Stress at home 05/17/2015  . Cerumen impaction 05/17/2015  . Primary osteoarthritis of both knees 11/29/2014  . Fracture of radial head, right, closed 10/08/2013  . Left  wrist sprain 10/08/2013  . Low back pain 04/14/2012  . FELON 05/27/2011  . CHRONIC OBSTRUCTIVE PULMONARY DISEASE, ACUTE 10/04/2010  . PNEUMONIA, ORGANISM UNSPECIFIED 09/20/2010  . SINUSITIS 09/22/2009  . MAMMOGRAM, ABNORMAL 07/18/2009  . HYPERLIPIDEMIA 05/18/2009  . POSTMENOPAUSAL STATUS 05/18/2009  . MALIGNANT MELANOMA 04/20/2009  . EUSTACHIAN TUBE DYSFUNCTION, RIGHT 04/20/2009   JKerin Perna PTA 11/21/2015 12:41 PM  [place] [address]Phone: [phone]  Fax:  [phone]  Name: Olivia Frank MRN: [id number] Date of Birth: September 27, 1950

## 2015-11-22 ENCOUNTER — Ambulatory Visit
Admission: RE | Admit: 2015-11-22 | Discharge: 2015-11-22 | Disposition: A | Discharge status: Home / Self Care | Payer: Medicare Other | Source: Ambulatory Visit | Attending: Family Medicine | Admitting: Family Medicine

## 2015-11-22 DIAGNOSIS — M47812 Spondylosis without myelopathy or radiculopathy, cervical region: Secondary | ICD-10-CM | POA: Diagnosis not present

## 2015-11-22 MED ORDER — IOHEXOL 300 MG/ML  SOLN
1.0000 mL | Freq: Once | INTRAMUSCULAR | Status: AC | PRN
  Administered 2015-11-22: 1 mL via EPIDURAL

## 2015-11-22 MED ORDER — TRIAMCINOLONE ACETONIDE 40 MG/ML IJ SUSP (RADIOLOGY)
60.0000 mg | Freq: Once | INTRAMUSCULAR | Status: AC
  Administered 2015-11-22: 60 mg via EPIDURAL

## 2015-11-22 NOTE — Discharge Instructions (Signed)

## 2015-11-24 ENCOUNTER — Encounter: Payer: Medicare Other | Admitting: Rehabilitative and Restorative Service Providers"

## 2015-11-27 ENCOUNTER — Encounter: Payer: Self-pay | Admitting: Rehabilitative and Restorative Service Providers"

## 2015-11-27 ENCOUNTER — Ambulatory Visit (INDEPENDENT_AMBULATORY_CARE_PROVIDER_SITE_OTHER): Payer: Medicare Other | Admitting: Rehabilitative and Restorative Service Providers"

## 2015-11-27 DIAGNOSIS — M5412 Radiculopathy, cervical region: Secondary | ICD-10-CM

## 2015-11-27 DIAGNOSIS — M791 Myalgia, unspecified site: Secondary | ICD-10-CM

## 2015-11-27 DIAGNOSIS — M79602 Pain in left arm: Secondary | ICD-10-CM | POA: Diagnosis not present

## 2015-11-27 NOTE — Patient Instructions (Signed)
Resisted External Rotation: in Neutral - Bilateral   PALMS UP Sit or stand, tubing in both hands, elbows at sides, bent to 90, forearms forward. Pinch shoulder blades together and rotate forearms out. Keep elbows at sides. Repeat __10__ times per set. Do _2-3___ sets per session. Do _2-3___ sessions per day.   Low Row: Standing   Face anchor, feet shoulder width apart. Palms up, pull arms back, squeezing shoulder blades together. Repeat 10__ times per set. Do 2-3__ sets per session. Do 2-3__ sessions per week. Anchor Height: Waist     Strengthening: Resisted Extension   Hold tubing in right hand, arm forward. Pull arm back, elbow straight. Repeat _10___ times per set. Do 2-3____ sets per session. Do 2-3____ sessions per day.  Neurovascular: Median Nerve Stretch - Supine    Lie with neck supported, side-bent away from moving arm. Hold right arm out to side, elbow bent, thumb down, fingers and wrist bent back. Slowly straighten elbowas far as possible without pain. Hold for _60___ seconds. Repeat 2___ times per set. Do __2__ sets per day

## 2015-11-27 NOTE — Therapy (Signed)
[place], [place], [address] Phone: [phone]   Fax:  [phone]  Physical Therapy Treatment  Patient Details  Name: Olivia Frank MRN: [id number] Date of Birth: 12-Sep-1950 Referring Provider: Dr. Georgina Snell   Encounter Date: 11/27/2015      PT End of Session - 11/27/15 1151    Visit Number 6   Number of Visits 12   Date for PT Re-Evaluation 12/18/15   PT Start Time 1146   PT Stop Time 1239   PT Time Calculation (min) 53 min   Activity Tolerance Patient tolerated treatment well      Past Medical History  Diagnosis Date  . Melanoma ([place])   . COPD (chronic obstructive pulmonary disease) [facility])     Past Surgical History  Procedure Laterality Date  . Appendectomy    . Ovarian cyst removal    . Abdominal hysterectomy      There were no vitals filed for this visit.      Subjective Assessment - 11/27/15 1154    Subjective Patient reports that she received ESI cervical spine wednesday 11/22/15. She had some soreness and noticed aching and soreness in the arm several days which seemed to resolve by the weekend - enough for her to work in the yard for an 1 1/2 hr yesterday. She has soreness in the Lt shoulder today. She has made ergonomic changes iat work and home and feels these have helped some.    Currently in Pain? Yes   Pain Score 2    Pain Location Neck   Pain Orientation Left   Pain Descriptors / Indicators Sore   Pain Type Acute pain   Pain Onset More than a month ago   Pain Frequency Intermittent                         [facility] Adult PT Treatment/Exercise - 11/27/15 0001    Therapeutic Activites    Therapeutic Activities --  myofacial ball release work standing with ~4 in rubber ball   Neck Exercises: Machines for Strengthening   UBE (Upper Arm Bike) L2: 1.5 min each direction    Neck Exercises: Theraband   Scapula Retraction 20 reps  with noodle yellow TB    Shoulder Extension 20 reps;Red   Rows 20 reps;Red   Neck Exercises: Standing   Neck Retraction 10 reps;3 secs   Other Standing Exercises scap squeeze 10 sec x 10    Neck Exercises: Supine   Neck Retraction 10 secs;10 reps  with pillow   Other Supine Exercise neural stretch 60 sec hold x 2 reps    Other Supine Exercise snow angel 2 min static    Moist Heat Therapy   Number Minutes Moist Heat 15 Minutes   Moist Heat Location Cervical  upper trap/thoracic spine area    Electrical Stimulation   Electrical Stimulation Location bilat cervical; Lt cervical and trap area    Electrical Stimulation Action IFC   Electrical Stimulation Parameters to tolerance   Electrical Stimulation Goals Pain;Tone   Manual Therapy   Manual therapy comments pt supine    Joint Mobilization cervical upper thoracic CPA Grade II/III mobs    Soft tissue mobilization Lt/ Rt cervical paraspinals, upper trap, upper thoracic paraspinals. Lt scap lateral traction with TPR to rhomboid.    Myofascial Release suboccipital release, Lt pec release, platysma on Lt.    Manual Traction cervical 30 sec hold x 4  Neck Exercises: Stretches   Other Neck Stretches doorway x 30 x 3                PT Education - 11/27/15 1215    Education provided Yes   Education Details HEP   Person(s) Educated Patient   Methods Explanation;Demonstration;Tactile cues;Verbal cues;Handout   Comprehension Verbalized understanding;Returned demonstration;Verbal cues required;Tactile cues required             PT Long Term Goals - 11/27/15 1245    PT LONG TERM GOAL #1   Title Improve posture and alignment with pt to demo upright posture 12/18/15   Time 6   Period Weeks   Status Achieved   PT LONG TERM GOAL #2   Title Increase cervical ROM to [facility] 12/18/15   Time 6   Period Weeks   Status Partially Met   PT LONG TERM GOAL #3   Title Decrease pain and radicular symptoms allowing pt to sleep without awakending due to pain 12/18/15    Time 6   Period Weeks   Status Achieved   PT LONG TERM GOAL #4   Title Eliminate radicuar symptoms Lt UE allowing pt to resume normal functional activities 12/18/15   Time 6   Period Weeks   Status Partially Met   PT LONG TERM GOAL #5   Title I in HEP 12/18/15   Time 6   Period Weeks   Status On-going   PT LONG TERM GOAL #6   Title Improve FOTO to </= 31% limitation 12/18/15   Time 6   Period Weeks   Status On-going               Plan - 11/27/15 1243    Clinical Impression Statement Good response to ESI and therapy. Progressing well with rehab goals. Cotninues to have deep muscular tightness Lt C4/5/6 area with limited cervical mobility. Pt needs continued work on postural strengthening. Will be out of town this week. Schedule for return appt next week.    Rehab Potential Good   PT Frequency 2x / week   PT Duration 6 weeks   PT Treatment/Interventions Patient/family education;ADLs/Self Care Home Management;Neuromuscular re-education;Manual techniques;Dry needling;Therapeutic exercise;Therapeutic activities;Cryotherapy;Electrical Stimulation;Iontophoresis '4mg'$ /ml Dexamethasone;Moist Heat;Ultrasound   PT Next Visit Plan postural correction; stretching pecs; manual work through cervical and thoracic area; modalities as indicated; continue education re - spine care and ergonomics Trial of [place] to Lt lateral cervical musculature very tight C4/5/6    PT Home Exercise Plan HEP; myofacial ball release; TENS    Consulted and Agree with Plan of Care Patient      Patient will benefit from skilled therapeutic intervention in order to improve the following deficits and impairments:  Postural dysfunction, Improper body mechanics, Pain, Increased fascial restricitons, Decreased range of motion, Decreased mobility, Decreased endurance, Decreased activity tolerance, Increased muscle spasms  Visit Diagnosis: Cervical radiculopathy - Plan: PT plan of care cert/re-cert  Pain of left upper extremity -  Plan: PT plan of care cert/re-cert  Myalgia - Plan: PT plan of care cert/re-cert     Problem List Patient Active Problem List   Diagnosis Date Noted  . Cervical disc disorder with radiculopathy of cervical region 11/06/2015  . Hyperlipidemia 06/06/2015  . Hypertriglyceridemia 06/06/2015  . Elevated blood pressure 05/17/2015  . Stress at home 05/17/2015  . Cerumen impaction 05/17/2015  . Primary osteoarthritis of both knees 11/29/2014  . Fracture of radial head, right, closed 10/08/2013  . Left wrist sprain 10/08/2013  . Low  back pain 04/14/2012  . FELON 05/27/2011  . CHRONIC OBSTRUCTIVE PULMONARY DISEASE, ACUTE 10/04/2010  . PNEUMONIA, ORGANISM UNSPECIFIED 09/20/2010  . SINUSITIS 09/22/2009  . MAMMOGRAM, ABNORMAL 07/18/2009  . HYPERLIPIDEMIA 05/18/2009  . POSTMENOPAUSAL STATUS 05/18/2009  . MALIGNANT MELANOMA 04/20/2009  . EUSTACHIAN TUBE DYSFUNCTION, RIGHT 04/20/2009    Remedy Corporan P Helene Kelp PT, [facility] 11/27/2015, 12:50 PM  [facility] [place], [place], [address] Phone: [phone]   Fax:  [phone]  Name: Sonni Barse MRN: [id number] Date of Birth: 1950/12/13

## 2015-11-29 ENCOUNTER — Ambulatory Visit (INDEPENDENT_AMBULATORY_CARE_PROVIDER_SITE_OTHER): Payer: Medicare Other | Admitting: Rehabilitative and Restorative Service Providers"

## 2015-11-29 ENCOUNTER — Encounter: Payer: Self-pay | Admitting: Rehabilitative and Restorative Service Providers"

## 2015-11-29 ENCOUNTER — Telehealth: Payer: Self-pay

## 2015-11-29 DIAGNOSIS — M79602 Pain in left arm: Secondary | ICD-10-CM

## 2015-11-29 DIAGNOSIS — M5412 Radiculopathy, cervical region: Secondary | ICD-10-CM | POA: Diagnosis not present

## 2015-11-29 DIAGNOSIS — M791 Myalgia, unspecified site: Secondary | ICD-10-CM

## 2015-11-29 MED ORDER — METHOCARBAMOL 500 MG PO TABS: 500.0000 mg | ORAL_TABLET | Freq: Three times a day (TID) | ORAL | Status: DC

## 2015-11-29 NOTE — Telephone Encounter (Signed)
I called in robaxin muscle relaxer. We will repeat the injection in 1 week if you are not better.

## 2015-11-29 NOTE — Therapy (Signed)
[place], [place], [address] Phone: [phone]   Fax:  [phone]  Physical Therapy Treatment  Patient Details  Name: Olivia Frank MRN: [id number] Date of Birth: 1951/07/14 Referring Provider: Dr. Georgina Frank   Encounter Date: 11/29/2015      PT End of Session - 11/29/15 0742    Visit Number 7   Number of Visits 12   Date for PT Re-Evaluation 12/18/15   PT Start Time 0731   PT Stop Time 0818   PT Time Calculation (min) 47 min   Activity Tolerance Patient tolerated treatment well      Past Medical History  Diagnosis Date  . Melanoma ([place])   . COPD (chronic obstructive pulmonary disease) [facility])     Past Surgical History  Procedure Laterality Date  . Appendectomy    . Ovarian cyst removal    . Abdominal hysterectomy      There were no vitals filed for this visit.      Subjective Assessment - 11/29/15 0749    Subjective Increased pain Monday evening which continued into the night. She was unable to sleep. She had continued pain yesterday but was able to sleep last night. Headed to [place] for family vacation the end of this week.    Currently in Pain? Yes   Pain Score 6    Pain Location Neck   Pain Orientation Left   Pain Descriptors / Indicators Sore   Pain Type Acute pain   Pain Radiating Towards into Lt shoulder and arm             [facility] PT Assessment - 11/29/15 0001    Assessment   Medical Diagnosis cervical radiculopathy Lt    Referring Provider Dr. Georgina Frank    Onset Date/Surgical Date 11/02/15   Hand Dominance Right   Next MD Visit 12-06-15   AROM   Cervical Flexion 48   Cervical Extension 36   Cervical - Right Side Bend 28   Cervical - Left Side Bend 34   Cervical - Right Rotation 58   Cervical - Left Rotation 59                     [facility] Adult PT Treatment/Exercise - 11/29/15 0001    Neck Exercises: Machines for Strengthening   UBE (Upper Arm  Bike) L1: 1 min each direction    Neck Exercises: Standing   Neck Retraction 10 reps;3 secs   Other Standing Exercises scap squeeze 10 sec x 10    Moist Heat Therapy   Number Minutes Moist Heat 15 Minutes   Moist Heat Location Cervical  upper trap/thoracic spine area    Ultrasound   Ultrasound Location Lt lateral cervical musculature   Ultrasound Parameters 1 mHz; 1.5 w/cm2; 100%; 8 min    Traction   Type of Traction Cervical   Min (lbs) 14   Max (lbs) 20   Hold Time 60   Rest Time 10   Time 16 min    Neck Exercises: Stretches   Other Neck Stretches doorway x 30 x 3                     PT Long Term Goals - 11/29/15 0745    PT LONG TERM GOAL #1   Title Improve posture and alignment with pt to demo upright posture 12/18/15   Time 6   Period Weeks   Status Achieved   PT LONG  TERM GOAL #2   Title Increase cervical ROM to [facility] 12/18/15   Time 6   Period Weeks   Status Partially Met   PT LONG TERM GOAL #3   Title Decrease pain and radicular symptoms allowing pt to sleep without awakending due to pain 12/18/15   Time 6   Period Weeks   Status Achieved   PT LONG TERM GOAL #4   Title Eliminate radicuar symptoms Lt UE allowing pt to resume normal functional activities 12/18/15   Time 6   Period Weeks   Status On-going   PT LONG TERM GOAL #5   Title I in HEP 12/18/15   Time 6   Period Weeks   Status On-going   PT LONG TERM GOAL #6   Title Improve FOTO to </= 31% limitation 12/18/15   Time 6   Period Weeks   Status On-going               Plan - 11/29/15 0742    Clinical Impression Statement Flare up of symptoms Monday - did work in the yard for 1 1/2 hours Sunday. Continues to have limited cervical mobility and has increased radicular syjptoms in the past two days. Responds well to treatment. Will monitor response to [place] and traction    Rehab Potential Good   PT Frequency 2x / week   PT Duration 6 weeks   PT Treatment/Interventions Patient/family  education;ADLs/Self Care Home Management;Neuromuscular re-education;Manual techniques;Dry needling;Therapeutic exercise;Therapeutic activities;Cryotherapy;Electrical Stimulation;Iontophoresis 43m/ml Dexamethasone;Moist Heat;Ultrasound   PT Next Visit Plan postural correction; stretching pecs; manual work through cervical and thoracic area; modalities as indicated; continue education re - spine care and ergonomics Trial of [place] Lt lateral cervical musculature very tight C4/5/6 - assess response to US/traction    PT Home Exercise Plan HEP; myofacial ball release; TENS    Consulted and Agree with Plan of Care Patient      Patient will benefit from skilled therapeutic intervention in order to improve the following deficits and impairments:  Postural dysfunction, Improper body mechanics, Pain, Increased fascial restricitons, Decreased range of motion, Decreased mobility, Decreased endurance, Decreased activity tolerance, Increased muscle spasms  Visit Diagnosis: Pain of left upper extremity  Myalgia  Cervical radiculopathy     Problem List Patient Active Problem List   Diagnosis Date Noted  . Cervical disc disorder with radiculopathy of cervical region 11/06/2015  . Hyperlipidemia 06/06/2015  . Hypertriglyceridemia 06/06/2015  . Elevated blood pressure 05/17/2015  . Stress at home 05/17/2015  . Cerumen impaction 05/17/2015  . Primary osteoarthritis of both knees 11/29/2014  . Fracture of radial head, right, closed 10/08/2013  . Left wrist sprain 10/08/2013  . Low back pain 04/14/2012  . FELON 05/27/2011  . CHRONIC OBSTRUCTIVE PULMONARY DISEASE, ACUTE 10/04/2010  . PNEUMONIA, ORGANISM UNSPECIFIED 09/20/2010  . SINUSITIS 09/22/2009  . MAMMOGRAM, ABNORMAL 07/18/2009  . HYPERLIPIDEMIA 05/18/2009  . POSTMENOPAUSAL STATUS 05/18/2009  . MALIGNANT MELANOMA 04/20/2009  . EUSTACHIAN TUBE DYSFUNCTION, RIGHT 04/20/2009    Olivia Frank PNilda SimmerPT, MPH  11/29/2015, 10:29 AM  [facility] [address]Phone: [phone]  Fax:  [phone] Name: Olivia RedlerMRN: [id number] of Birth: [birth year]

## 2015-11-29 NOTE — Telephone Encounter (Signed)
Pt called stating that she had epidural steroid injections done last week and that she is still in quite a bit of pain and that gabapentin is providing little relief . She stated that at PT they mentioned that she may benefit from a muscle relaxer. She is interested in trying flexeril, but is willing to try what you think would be best for her. She would like to use the [facility] pharmacy on [address]. Please advise.

## 2015-11-30 NOTE — Telephone Encounter (Signed)
Pt.notified

## 2015-12-04 ENCOUNTER — Ambulatory Visit (INDEPENDENT_AMBULATORY_CARE_PROVIDER_SITE_OTHER): Payer: Medicare Other | Admitting: Physical Therapy

## 2015-12-04 DIAGNOSIS — Z7409 Other reduced mobility: Secondary | ICD-10-CM

## 2015-12-04 DIAGNOSIS — M5412 Radiculopathy, cervical region: Secondary | ICD-10-CM | POA: Diagnosis not present

## 2015-12-04 DIAGNOSIS — R531 Weakness: Secondary | ICD-10-CM

## 2015-12-04 DIAGNOSIS — M79602 Pain in left arm: Secondary | ICD-10-CM

## 2015-12-04 DIAGNOSIS — M791 Myalgia, unspecified site: Secondary | ICD-10-CM

## 2015-12-04 DIAGNOSIS — M7918 Myalgia, other site: Secondary | ICD-10-CM

## 2015-12-04 DIAGNOSIS — M797 Fibromyalgia: Secondary | ICD-10-CM

## 2015-12-04 DIAGNOSIS — R6889 Other general symptoms and signs: Secondary | ICD-10-CM

## 2015-12-04 NOTE — Therapy (Signed)
[facility] [place] [address] [place], [place], [address] Phone: [phone]   Fax:  [phone]  Physical Therapy Treatment  Patient Details  Name: Olivia Frank MRN: [id number] Date of Birth: May 27, 1951 Referring Provider: Dr. Denyse Amass   Encounter Date: 12/04/2015      PT End of Session - 12/04/15 1154    Visit Number 8   Number of Visits 12   Date for PT Re-Evaluation 12/18/15   PT Start Time 1103   PT Stop Time 1158   PT Time Calculation (min) 55 min   Activity Tolerance Patient tolerated treatment well      Past Medical History  Diagnosis Date  . Melanoma ([facility])   . COPD (chronic obstructive pulmonary disease) [facility])     Past Surgical History  Procedure Laterality Date  . Appendectomy    . Ovarian cyst removal    . Abdominal hysterectomy      There were no vitals filed for this visit.      Subjective Assessment - 12/04/15 1111    Subjective Pt reports she was given a different muscle relaxer and she feels this has helped.  She is uncertain if traction and ultrasound helped.  Car ride to [place] was painful, but over the course of trip with use of ice/heat and exercises, pain eased up.  Night time when trying to sleep,symptoms are worse.   Pertinent History some "tension" in neck and shoulders; arthritis in knees; fx Rt elbow most recently ~1 year ago.     Patient Stated Goals get rid of the pain and keep it from returning   Currently in Pain? Yes   Pain Score 4    Pain Location Neck   Pain Orientation Left   Pain Descriptors / Indicators Sore   Pain Radiating Towards into Lt shoulder   Pain Relieving Factors Heat, TENS, muscle relaxers             [facility] PT Assessment - 12/04/15 0001    AROM   Cervical Flexion 60   Cervical Extension 45   Cervical - Right Side Bend 30   Cervical - Left Side Bend 30  with pain at end range   Cervical - Right Rotation 70   Cervical - Left Rotation 65 with  pain at end range     FOTO score:  47% limited.  (goal 31% or less)  Initial eval was 49% limited.        [facility] Adult PT Treatment/Exercise - 12/04/15 0001    Neck Exercises: Machines for Strengthening   UBE (Upper Arm Bike) L2: 1.5 min each direction    Neck Exercises: Supine   Cervical Rotation Right;Left;5 reps   Other Supine Exercise snow angel 2 min static    Moist Heat Therapy   Number Minutes Moist Heat 15 Minutes   Moist Heat Location Cervical  upper trap/thoracic spine area during traction   Traction   Type of Traction Cervical   Min (lbs) 14   Max (lbs) 20   Hold Time 60   Rest Time 10   Time 15   Manual Therapy   Manual therapy comments pt supine   Soft tissue mobilization Lt/ Rt cervical paraspinals, upper trap, upper thoracic paraspinals. Lt scap lateral traction with TPR to rhomboid.  pin and stretch to Lt upper trap and rhomboid with passive ROM to cervical and scapula   Myofascial Release suboccipital release, Lt pec release, platysma on Lt.    Neck Exercises:  Stretches   Upper Trapezius Stretch 2 reps;30 seconds   Levator Stretch 2 reps;30 seconds   Other Neck Stretches doorway 4 positions x 30 sec x 2 reps each position                     PT Long Term Goals - 12/04/15 1115    PT LONG TERM GOAL #1   Title Improve posture and alignment with pt to demo upright posture 12/18/15   Time 6   Period Weeks   Status Achieved   PT LONG TERM GOAL #2   Title Increase cervical ROM to [facility] 12/18/15   Time 6   Period Weeks   Status Partially Met   PT LONG TERM GOAL #3   Title Decrease pain and radicular symptoms allowing pt to sleep without awakending due to pain 12/18/15   Time 6   Period Weeks   Status On-going  continues to wake with symptoms 12/04/15   PT LONG TERM GOAL #4   Title Eliminate radicuar symptoms Lt UE allowing pt to resume normal functional activities 12/18/15   Time 6   Period Weeks   Status On-going   PT LONG TERM GOAL #5   Title I  in HEP 12/18/15   Time 6   Period Weeks   Status On-going   PT LONG TERM GOAL #6   Title Improve FOTO to </= 31% limitation 12/18/15   Time 6   Period Weeks   Status On-going               Plan - 12/04/15 1151    Clinical Impression Statement Pt's symptoms decreasing since recent flare up last week; reduced radicular symptoms.  Pt demonstrated improved cervical mobility since last visit.  Pt reported decrease in pain with manual therapy and slight reduction in symptoms with traction.  Progressing towards established goals.    Rehab Potential Good   PT Frequency 2x / week   PT Duration 6 weeks   PT Treatment/Interventions Patient/family education;ADLs/Self Care Home Management;Neuromuscular re-education;Manual techniques;Dry needling;Therapeutic exercise;Therapeutic activities;Cryotherapy;Electrical Stimulation;Iontophoresis '4mg'$ /ml Dexamethasone;Moist Heat;Ultrasound   PT Next Visit Plan Assess response to traction and manual work.  Continue postural strengthening/ stretching.  Modalities as indicated.    Consulted and Agree with Plan of Care Patient      Patient will benefit from skilled therapeutic intervention in order to improve the following deficits and impairments:  Postural dysfunction, Improper body mechanics, Pain, Increased fascial restricitons, Decreased range of motion, Decreased mobility, Decreased endurance, Decreased activity tolerance, Increased muscle spasms  Visit Diagnosis: Pain of left upper extremity  Myalgia  Cervical radiculopathy  Myofacial muscle pain  Decreased strength, endurance, and mobility     Problem List Patient Active Problem List   Diagnosis Date Noted  . Cervical disc disorder with radiculopathy of cervical region 11/06/2015  . Hyperlipidemia 06/06/2015  . Hypertriglyceridemia 06/06/2015  . Elevated blood pressure 05/17/2015  . Stress at home 05/17/2015  . Cerumen impaction 05/17/2015  . Primary osteoarthritis of both knees  11/29/2014  . Fracture of radial head, right, closed 10/08/2013  . Left wrist sprain 10/08/2013  . Low back pain 04/14/2012  . FELON 05/27/2011  . CHRONIC OBSTRUCTIVE PULMONARY DISEASE, ACUTE 10/04/2010  . PNEUMONIA, ORGANISM UNSPECIFIED 09/20/2010  . SINUSITIS 09/22/2009  . MAMMOGRAM, ABNORMAL 07/18/2009  . HYPERLIPIDEMIA 05/18/2009  . POSTMENOPAUSAL STATUS 05/18/2009  . MALIGNANT MELANOMA 04/20/2009  . EUSTACHIAN TUBE DYSFUNCTION, RIGHT 04/20/2009   Kerin Perna, PTA 12/04/2015 12:14 PM  [facility] [place], [place], [address] Phone: [phone]   Fax:  [phone]  Name: Rosemary Mossbarger MRN: [id number] Date of Birth: 12-21-1950

## 2015-12-06 ENCOUNTER — Encounter: Payer: Self-pay | Admitting: Rehabilitative and Restorative Service Providers"

## 2015-12-06 ENCOUNTER — Ambulatory Visit (INDEPENDENT_AMBULATORY_CARE_PROVIDER_SITE_OTHER): Payer: Medicare Other | Admitting: Sports Medicine

## 2015-12-06 ENCOUNTER — Encounter: Payer: Self-pay | Admitting: Sports Medicine

## 2015-12-06 ENCOUNTER — Ambulatory Visit (INDEPENDENT_AMBULATORY_CARE_PROVIDER_SITE_OTHER): Payer: Medicare Other | Admitting: Rehabilitative and Restorative Service Providers"

## 2015-12-06 DIAGNOSIS — M5412 Radiculopathy, cervical region: Secondary | ICD-10-CM

## 2015-12-06 DIAGNOSIS — M4722 Other spondylosis with radiculopathy, cervical region: Secondary | ICD-10-CM | POA: Diagnosis not present

## 2015-12-06 DIAGNOSIS — M79602 Pain in left arm: Secondary | ICD-10-CM | POA: Diagnosis present

## 2015-12-06 DIAGNOSIS — M791 Myalgia, unspecified site: Secondary | ICD-10-CM

## 2015-12-06 MED ORDER — MELOXICAM 15 MG PO TABS: ORAL_TABLET | ORAL | Status: DC

## 2015-12-06 NOTE — Assessment & Plan Note (Signed)
Good response to left-sided cervical epidural with resolution of radicular pain, still has some axial neck pain on the left side, there is evidence of C3-C4 as well as C4-C5 left-sided worse than right sided facet disease. We are going to proceed with a left C3-C4 and C4-C5 facet injection for diagnostic and therapeutic purposes.

## 2015-12-06 NOTE — Progress Notes (Signed)
  Subjective:    CC: Neck pain  HPI: Cervical spondylosis: Olivia Frank is a pleasant 65 year old female, she was seen in appropriate we treated conservatively and then with an epidural that resolved all of her left-sided radicular pain, unfortunately she continues to have some axial pain on the left side, worse with extension side rotation to the left, stiffness in the morning. She is not yet taking an NSAID and is agreeable to start one. Pain is moderate, persistent without radiation.  Past medical history, Surgical history, Family history not pertinant except as noted below, Social history, Allergies, and medications have been entered into the medical record, reviewed, and no changes needed.   Review of Systems: No fevers, chills, night sweats, weight loss, chest pain, or shortness of breath.   Objective:    General: Well Developed, well nourished, and in no acute distress.  Neuro: Alert and oriented x3, extra-ocular muscles intact, sensation grossly intact.  HEENT: Normocephalic, atraumatic, pupils equal round reactive to light, neck supple, no masses, no lymphadenopathy, thyroid nonpalpable.  Skin: Warm and dry, no rashes. Cardiac: Regular rate and rhythm, no murmurs rubs or gallops, no lower extremity edema.  Respiratory: Clear to auscultation bilaterally. Not using accessory muscles, speaking in full sentences.  On further review of her MRI there is left-sided C3-C4 and C4-C5 moderate to severe facet arthritis.   Impression and Recommendations:   I spent 25 minutes with this patient, greater than 50% was face-to-face time counseling regarding the above diagnoses

## 2015-12-06 NOTE — Therapy (Addendum)
[address] [place], [place], [address] Phone: [phone]   Fax:  [phone]  Physical Therapy Treatment  Patient Details  Name: Olivia Frank MRN: [id number] Date of Birth: 02-25-1951 Referring Provider: Dr. Georgina Snell   Encounter Date: 12/06/2015      PT End of Session - 12/06/15 0927    Visit Number 9   Number of Visits 12   Date for PT Re-Evaluation 12/18/15   PT Start Time 0846   PT Stop Time 0924   PT Time Calculation (min) 38 min   Activity Tolerance Patient tolerated treatment well      Past Medical History  Diagnosis Date  . Melanoma ([place])   . COPD (chronic obstructive pulmonary disease) [facility])     Past Surgical History  Procedure Laterality Date  . Appendectomy    . Ovarian cyst removal    . Abdominal hysterectomy      There were no vitals filed for this visit.      Subjective Assessment - 12/06/15 0855    Subjective Danali reports that she has less radicular pain and tingling. She continues to have pain in the Lt lateral neck area. She has flare ups periodically but does not know what may cause the flare ups.    Currently in Pain? Yes   Pain Score 4    Pain Location Neck   Pain Orientation Left   Pain Descriptors / Indicators Sore                         [facility] Adult PT Treatment/Exercise - 12/06/15 0001    Neck Exercises: Machines for Strengthening   UBE (Upper Arm Bike) L2: 1.5 min each direction    Neck Exercises: Standing   Neck Retraction 10 reps;3 secs   Other Standing Exercises scap squeeze 10 sec x 10    Other Standing Exercises Doorway 3 position x 30 sec    Neck Exercises: Supine   Neck Retraction 10 secs;10 reps   Cryotherapy   Number Minutes Cryotherapy 10 Minutes   Cryotherapy Location Cervical   Type of Cryotherapy Ice pack   Ultrasound   Ultrasound Location Lt lateral cervical muaculature    Ultrasound Parameters 1 mHz; 1.2 w/cm2; 100%; 8 min     Manual Therapy   Manual therapy comments pt supine   Joint Mobilization cervical upper thoracic CPA Grade II/III mobs    Soft tissue mobilization Lt/ Rt cervical paraspinals, upper trap, upper thoracic paraspinals. Lt scap lateral traction with TPR to rhomboid.  pin and stretch to Lt upper trap and rhomboid with passive ROM to cervical and scapula   Myofascial Release suboccipital release, Lt pec release, platysma on Lt.    Manual Traction cervical 30 sec hold x 4                 PT Education - 12/06/15 0927    Education provided Yes   Education Details HEP; posture; discussed TDN    Person(s) Educated Patient   Methods Explanation   Comprehension Verbalized understanding             PT Long Term Goals - 12/06/15 0931    PT LONG TERM GOAL #1   Title Improve posture and alignment with pt to demo upright posture 12/18/15   Time 6   Period Weeks   Status Achieved   PT LONG TERM GOAL #2   Title Increase cervical ROM to [facility] 12/18/15  Time 6   Period Weeks   Status Partially Met   PT LONG TERM GOAL #3   Title Decrease pain and radicular symptoms allowing pt to sleep without awakending due to pain 12/18/15   Time 6   Period Weeks   Status On-going   PT LONG TERM GOAL #4   Title Eliminate radicuar symptoms Lt UE allowing pt to resume normal functional activities 12/18/15   Time 6   Period Weeks   Status On-going   PT LONG TERM GOAL #5   Title I in HEP 12/18/15   Time 6   Period Weeks   Status On-going   PT LONG TERM GOAL #6   Title Improve FOTO to </= 31% limitation 12/18/15   Time 6   Period Weeks   Status On-going               Plan - 12/06/15 3716    Clinical Impression Statement Some improvement overall. She continues to have some cervical pain and intermittent Lt UE radicular symptoms. 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 Patient may benefit form trial of TDN and continued PT to address  remaining problems.    Rehab Potential Good   PT Frequency 2x / week   PT Duration 6 weeks   PT Treatment/Interventions Patient/family education;ADLs/Self Care Home Management;Neuromuscular re-education;Manual techniques;Dry needling;Therapeutic exercise;Therapeutic activities;Cryotherapy;Electrical Stimulation;Iontophoresis '4mg'$ /ml Dexamethasone;Moist Heat;Ultrasound   PT Next Visit Plan Assess response to traction and manual work.  Continue postural strengthening/ stretching.  Modalities as indicated.    PT Home Exercise Plan HEP; myofacial ball release; TENS    Consulted and Agree with Plan of Care Patient      Patient will benefit from skilled therapeutic intervention in order to improve the following deficits and impairments:  Postural dysfunction, Improper body mechanics, Pain, Increased fascial restricitons, Decreased range of motion, Decreased mobility, Decreased endurance, Decreased activity tolerance, Increased muscle spasms  Visit Diagnosis: Pain of left upper extremity  Myalgia  Cervical radiculopathy     Problem List Patient Active Problem List   Diagnosis Date Noted  . Cervical disc disorder with radiculopathy of cervical region 11/06/2015  . Hyperlipidemia 06/06/2015  . Hypertriglyceridemia 06/06/2015  . Elevated blood pressure 05/17/2015  . Stress at home 05/17/2015  . Cerumen impaction 05/17/2015  . Primary osteoarthritis of both knees 11/29/2014  . Fracture of radial head, right, closed 10/08/2013  . Left wrist sprain 10/08/2013  . Low back pain 04/14/2012  . FELON 05/27/2011  . CHRONIC OBSTRUCTIVE PULMONARY DISEASE, ACUTE 10/04/2010  . PNEUMONIA, ORGANISM UNSPECIFIED 09/20/2010  . SINUSITIS 09/22/2009  . MAMMOGRAM, ABNORMAL 07/18/2009  . HYPERLIPIDEMIA 05/18/2009  . POSTMENOPAUSAL STATUS 05/18/2009  . MALIGNANT MELANOMA 04/20/2009  . EUSTACHIAN TUBE DYSFUNCTION, RIGHT 04/20/2009    Ahlana Slaydon Nilda Simmer PT, MPH  12/06/2015, 9:33 AM  [facility] [address], [place], [address] Phone: [phone]   Fax:  [phone]  Name: Michol Emory MRN: [id number] Date of Birth: Nov 27, 1950    PHYSICAL THERAPY DISCHARGE SUMMARY  Visits from Start of Care: 9  Current functional level related to goals / functional outcomes: Improving with PT, injections; HEP   Remaining deficits: Continued intermittent symptoms    Education / Equipment: HEP  Plan: Patient agrees to discharge.  Patient goals were partially met. Patient is being discharged due to being pleased with the current functional level.  ?????     Antwon Rochin P. Helene Kelp PT,  MPH 01/31/2016 1:23 PM

## 2015-12-12 ENCOUNTER — Other Ambulatory Visit: Payer: Medicare Other

## 2015-12-13 ENCOUNTER — Telehealth: Payer: Self-pay

## 2015-12-13 NOTE — Telephone Encounter (Signed)
Pt called stating she's starting to experience new symptoms but is scheduled to have facet injections next week at [place]. Is wondering if she needs to cancel appointment and have new symptoms evaluated first. Please advise.

## 2015-12-14 NOTE — Telephone Encounter (Signed)
Left thumb started having numbness from the tip to the base. Having numbness and tingling in the hand at night while laying down only. Feels like she noticed the symptoms when she started taking the anti-inflammatory.

## 2015-12-14 NOTE — Telephone Encounter (Signed)
Have her describe the new symptom, but we should keep the appointment for now.

## 2015-12-14 NOTE — Telephone Encounter (Signed)
Sounds like carpal tunnel syndrome, come see me on an elective basis for that, and please keep appointment for back injection.

## 2015-12-15 NOTE — Telephone Encounter (Signed)
Left detailed message on VM.

## 2015-12-15 NOTE — Telephone Encounter (Signed)
Left message to call back  

## 2015-12-19 ENCOUNTER — Ambulatory Visit
Admission: RE | Admit: 2015-12-19 | Discharge: 2015-12-19 | Disposition: A | Discharge status: Home / Self Care | Payer: Medicare Other | Source: Ambulatory Visit | Attending: Sports Medicine | Admitting: Sports Medicine

## 2015-12-19 DIAGNOSIS — M47812 Spondylosis without myelopathy or radiculopathy, cervical region: Secondary | ICD-10-CM | POA: Diagnosis not present

## 2015-12-19 DIAGNOSIS — M4722 Other spondylosis with radiculopathy, cervical region: Secondary | ICD-10-CM

## 2015-12-19 MED ORDER — IOPAMIDOL (ISOVUE-M 300) INJECTION 61%
15.0000 mL | Freq: Once | INTRAMUSCULAR | Status: AC | PRN
  Administered 2015-12-19: 15 mL via INTRATHECAL

## 2015-12-19 MED ORDER — DEXAMETHASONE SODIUM PHOSPHATE 4 MG/ML IJ SOLN: 6.0000 mg | Freq: Once | INTRAMUSCULAR | Status: DC

## 2015-12-19 NOTE — Discharge Instructions (Signed)

## 2016-01-03 ENCOUNTER — Ambulatory Visit (INDEPENDENT_AMBULATORY_CARE_PROVIDER_SITE_OTHER): Payer: Medicare Other | Admitting: Sports Medicine

## 2016-01-03 ENCOUNTER — Encounter: Payer: Self-pay | Admitting: Sports Medicine

## 2016-01-03 VITALS — BP 108/68 | HR 90 | Resp 18 | Wt 167.9 lb

## 2016-01-03 DIAGNOSIS — M4722 Other spondylosis with radiculopathy, cervical region: Secondary | ICD-10-CM

## 2016-01-03 NOTE — Progress Notes (Signed)
  Subjective:    CC: Follow-up after facet injections  HPI: This is a pleasant 65 year old female, we provided good relief of her radicular pain with a left-sided cervical epidural, still had some axial neck pain that sounded facetogenic and a seeker 3 C4 and C4-C5 facet injection a left-sided now resulted in complete relief of her axial neck pain. She still has a bit of paresthesias in her left thumb that she feels as though she can live with, she is now heading off to [place].  Past medical history, Surgical history, Family history not pertinant except as noted below, Social history, Allergies, and medications have been entered into the medical record, reviewed, and no changes needed.   Review of Systems: No fevers, chills, night sweats, weight loss, chest pain, or shortness of breath.   Objective:    General: Well Developed, well nourished, and in no acute distress.  Neuro: Alert and oriented x3, extra-ocular muscles intact, sensation grossly intact.  HEENT: Normocephalic, atraumatic, pupils equal round reactive to light, neck supple, no masses, no lymphadenopathy, thyroid nonpalpable.  Skin: Warm and dry, no rashes. Cardiac: Regular rate and rhythm, no murmurs rubs or gallops, no lower extremity edema.  Respiratory: Clear to auscultation bilaterally. Not using accessory muscles, speaking in full sentences. Neck: Negative spurling's Full neck range of motion Grip strength and sensation normal in bilateral hands Strength good C4 to T1 distribution No sensory change to C4 to T1 Reflexes normal Impression and Recommendations:

## 2016-01-03 NOTE — Assessment & Plan Note (Addendum)
Good response to left-sided cervical epidural with good improvement and radicular pain, we then proceeded with a left C3-4 and a C4-5 facet injection at the last visit that has now resulted in percent relief of the axial pain. There is still a bit of thumb numbness, she can live with this. Continue meloxicam for now.

## 2016-03-05 DIAGNOSIS — L814 Other melanin hyperpigmentation: Secondary | ICD-10-CM | POA: Diagnosis not present

## 2016-03-05 DIAGNOSIS — L218 Other seborrheic dermatitis: Secondary | ICD-10-CM | POA: Diagnosis not present

## 2016-03-05 DIAGNOSIS — Z85828 Personal history of other malignant neoplasm of skin: Secondary | ICD-10-CM | POA: Diagnosis not present

## 2016-03-05 DIAGNOSIS — L821 Other seborrheic keratosis: Secondary | ICD-10-CM | POA: Diagnosis not present

## 2016-03-05 DIAGNOSIS — D485 Neoplasm of uncertain behavior of skin: Secondary | ICD-10-CM | POA: Diagnosis not present

## 2016-03-05 DIAGNOSIS — L732 Hidradenitis suppurativa: Secondary | ICD-10-CM | POA: Diagnosis not present

## 2016-03-05 DIAGNOSIS — D1801 Hemangioma of skin and subcutaneous tissue: Secondary | ICD-10-CM | POA: Diagnosis not present

## 2016-03-05 DIAGNOSIS — Z85820 Personal history of malignant melanoma of skin: Secondary | ICD-10-CM | POA: Diagnosis not present

## 2016-03-07 DIAGNOSIS — C44719 Basal cell carcinoma of skin of left lower limb, including hip: Secondary | ICD-10-CM | POA: Diagnosis not present

## 2016-03-22 ENCOUNTER — Encounter: Payer: Self-pay | Admitting: Physician Assistant

## 2016-03-22 DIAGNOSIS — C44719 Basal cell carcinoma of skin of left lower limb, including hip: Secondary | ICD-10-CM | POA: Insufficient documentation

## 2016-04-24 ENCOUNTER — Encounter: Payer: Self-pay | Admitting: Family Medicine

## 2016-04-24 ENCOUNTER — Ambulatory Visit (INDEPENDENT_AMBULATORY_CARE_PROVIDER_SITE_OTHER): Payer: Medicare Other | Admitting: Family Medicine

## 2016-04-24 DIAGNOSIS — H6121 Impacted cerumen, right ear: Secondary | ICD-10-CM | POA: Diagnosis not present

## 2016-04-24 DIAGNOSIS — H918X1 Other specified hearing loss, right ear: Secondary | ICD-10-CM | POA: Diagnosis not present

## 2016-04-24 NOTE — Progress Notes (Signed)
CC: Olivia Frank is a 65 y.o. female is here for Cerumen Impaction   Subjective: HPI:  1 week of right-sided hearing loss mild in severity. Gradual onset. Noticed all hours of the day. No pain or drainage from the ear. Denies any dizziness, headache, or any other motor or sensory disturbance. No interventions as of yet. She tells me she gets frequent cerumen impactions    Review Of Systems Outlined In HPI  Past Medical History:  Diagnosis Date  . COPD (chronic obstructive pulmonary disease) ([place])   . Melanoma [facility])     Past Surgical History:  Procedure Laterality Date  . ABDOMINAL HYSTERECTOMY    . APPENDECTOMY    . OVARIAN CYST REMOVAL     Family History  Problem Relation Age of Onset  . Cancer Mother     breast  . Hyperlipidemia Mother   . Heart disease Father   . Cancer Sister   . Seizures Sister   . Cancer Maternal Grandmother     breast  . Cancer Maternal Grandfather     colon    Social History   Social History  . Marital status: Married    Spouse name: N/A  . Number of children: N/A  . Years of education: N/A   Occupational History  . Not on file.   Social History Main Topics  . Smoking status: Current Every Day Smoker    Packs/day: 0.50    Years: 46.00    Types: Cigarettes  . Smokeless tobacco: Not on file  . Alcohol use No  . Drug use: No  . Sexual activity: Not on file   Other Topics Concern  . Not on file   Social History Narrative  . No narrative on file     Objective: BP (!) 149/79   Pulse 77   Wt 170 lb (77.1 kg)   BMI 28.29 kg/m   General: Alert and Oriented, No Acute Distress HEENT: Pupils equal, round, reactive to light. Conjunctivae clear.  External ears unremarkable, On initial exam the right ear canal has a cerumen impaction, following irrigation and curettage canals clear with intact TMs with appropriate landmarks.  Middle ear appears open without effusion. Pink inferior turbinates.  Moist mucous membranes, pharynx without  inflammation nor lesions.  Neck supple without palpable lymphadenopathy nor abnormal masses. Extremities: No peripheral edema.  Strong peripheral pulses.  Mental Status: No depression, anxiety, nor agitation. Skin: Warm and dry.  Assessment & Plan: Olivia Frank was seen today for cerumen impaction.  Diagnoses and all orders for this visit:  Hearing loss due to cerumen impaction, right   Indication: Cerumen impaction of the ear(s) Medical necessity statement: On physical examination, cerumen impairs clinically significant portions of the external auditory canal, and tympanic membrane. Noted obstructive, copious cerumen that cannot be removed without magnification and instrumentations requiring physician skills Consent: Discussed benefits and risks of procedure and verbal consent obtained Procedure: Patient was prepped for the procedure. Utilized an otoscope to assess and take note of the ear canal, the tympanic membrane, and the presence, amount, and placement of the cerumen. Gentle water irrigation and soft plastic curette was utilized to remove cerumen.  Post procedure examination: shows cerumen was completely removed. Patient tolerated procedure well. The patient is made aware that they may experience temporary vertigo, temporary hearing loss, and temporary discomfort. If these symptom last for more than 24 hours to call the clinic or proceed to the ED.  [phone]  Hearing restored after above procedure   No Follow-up  on file.

## 2016-06-18 ENCOUNTER — Other Ambulatory Visit: Payer: Self-pay | Admitting: Physician Assistant

## 2016-07-02 DIAGNOSIS — C44719 Basal cell carcinoma of skin of left lower limb, including hip: Secondary | ICD-10-CM | POA: Diagnosis not present

## 2016-09-05 ENCOUNTER — Other Ambulatory Visit: Payer: Self-pay | Admitting: Physician Assistant

## 2016-10-14 ENCOUNTER — Other Ambulatory Visit: Payer: Self-pay | Admitting: *Deleted

## 2016-10-14 MED ORDER — ATORVASTATIN CALCIUM 40 MG PO TABS: 40.0000 mg | ORAL_TABLET | Freq: Every day | ORAL | 0 refills | Status: DC

## 2016-10-18 ENCOUNTER — Ambulatory Visit: Payer: Medicare Other

## 2016-10-18 ENCOUNTER — Encounter: Payer: Medicare Other | Admitting: Physician Assistant

## 2016-11-25 ENCOUNTER — Ambulatory Visit (INDEPENDENT_AMBULATORY_CARE_PROVIDER_SITE_OTHER): Payer: Medicare Other | Admitting: Physician Assistant

## 2016-11-25 ENCOUNTER — Encounter: Payer: Self-pay | Admitting: Physician Assistant

## 2016-11-25 VITALS — BP 148/82 | HR 97 | Ht 65.0 in | Wt 174.0 lb

## 2016-11-25 DIAGNOSIS — Z23 Encounter for immunization: Secondary | ICD-10-CM

## 2016-11-25 DIAGNOSIS — Z0000 Encounter for general adult medical examination without abnormal findings: Secondary | ICD-10-CM | POA: Diagnosis not present

## 2016-11-25 DIAGNOSIS — Z13820 Encounter for screening for osteoporosis: Secondary | ICD-10-CM | POA: Diagnosis not present

## 2016-11-25 DIAGNOSIS — Z131 Encounter for screening for diabetes mellitus: Secondary | ICD-10-CM

## 2016-11-25 DIAGNOSIS — Z780 Asymptomatic menopausal state: Secondary | ICD-10-CM | POA: Diagnosis not present

## 2016-11-25 DIAGNOSIS — Z122 Encounter for screening for malignant neoplasm of respiratory organs: Secondary | ICD-10-CM

## 2016-11-25 DIAGNOSIS — R030 Elevated blood-pressure reading, without diagnosis of hypertension: Secondary | ICD-10-CM

## 2016-11-25 DIAGNOSIS — Z1211 Encounter for screening for malignant neoplasm of colon: Secondary | ICD-10-CM | POA: Diagnosis not present

## 2016-11-25 DIAGNOSIS — E7800 Pure hypercholesterolemia, unspecified: Secondary | ICD-10-CM

## 2016-11-25 DIAGNOSIS — Z1231 Encounter for screening mammogram for malignant neoplasm of breast: Secondary | ICD-10-CM | POA: Diagnosis not present

## 2016-11-25 DIAGNOSIS — F17200 Nicotine dependence, unspecified, uncomplicated: Secondary | ICD-10-CM | POA: Diagnosis not present

## 2016-11-25 DIAGNOSIS — C439 Malignant melanoma of skin, unspecified: Secondary | ICD-10-CM | POA: Diagnosis not present

## 2016-11-25 MED ORDER — VARENICLINE TARTRATE 0.5 MG X 11 & 1 MG X 42 PO MISC: ORAL | 0 refills | Status: DC

## 2016-11-25 MED ORDER — LISINOPRIL 5 MG PO TABS: 5.0000 mg | ORAL_TABLET | Freq: Every day | ORAL | 1 refills | Status: DC

## 2016-11-25 NOTE — Progress Notes (Signed)
Subjective:    Patient ID: Olivia Frank, female    DOB: August 09, 1951, 66 y.o.   MRN: [id number]  HPI     Review of Systems     Objective:   Physical Exam        Assessment & Plan:     Subjective:   Olivia Frank is a 66 y.o. female who presents for Medicare Annual (Subsequent) preventive examination.  Review of Systems:  No concerns today.        Objective:     Vitals: BP (!) 148/82   Pulse 97   Ht 5\' 5"  (1.651 m)   Wt 174 lb (78.9 kg)   BMI 28.96 kg/m   Body mass index is 28.96 kg/m.   Tobacco History  Smoking Status  . Current Every Day Smoker  . Packs/day: 0.50  . Years: 46.00  . Types: Cigarettes  Smokeless Tobacco  . Never Used     Ready to quit: Not Answered Counseling given: Not Answered   Past Medical History:  Diagnosis Date  . COPD (chronic obstructive pulmonary disease) ([place])   . Melanoma [facility])    Past Surgical History:  Procedure Laterality Date  . ABDOMINAL HYSTERECTOMY    . APPENDECTOMY    . OVARIAN CYST REMOVAL     Family History  Problem Relation Age of Onset  . Cancer Mother     breast  . Hyperlipidemia Mother   . Heart disease Father   . Cancer Sister   . Seizures Sister   . Cancer Maternal Grandmother     breast  . Cancer Maternal Grandfather     colon   History  Sexual Activity  . Sexual activity: Not on file    Outpatient Encounter Prescriptions as of 11/25/2016  Medication Sig  . atorvastatin (LIPITOR) 40 MG tablet Take 1 tablet (40 mg total) by mouth daily. NEED FOLLOW UP APPOINTMENT FOR MORE REFILLS  . vitamin B-12 (CYANOCOBALAMIN) 1000 MCG tablet Take 1,000 mcg by mouth daily.  [name] VITAMIN D, CHOLECALCIFEROL, PO Take 1,000 Units by mouth daily.  [name] lisinopril (PRINIVIL,ZESTRIL) 5 MG tablet Take 1 tablet (5 mg total) by mouth daily.  . varenicline (CHANTIX STARTING MONTH PAK) 0.5 MG X 11 & 1 MG X 42 tablet Take one 0.5mg  tablet by mouth once daily for 3 days, then increase to one 0.5mg  tablet twice daily  for 3 days, then increase to one 1mg  tablet twice daily.   No facility-administered encounter medications on file as of 11/25/2016.     Activities of Daily Living No flowsheet data found.  Patient Care Team: Donella Stade, PA-C as PCP - General (Family Medicine)    Assessment:     Exercise Activities and Dietary recommendations    Goals    None     Fall Risk No flowsheet data found. Depression Screen PHQ 2/9 Scores 11/25/2016  PHQ - 2 Score 1     Cognitive Function     6CIT Screen 11/25/2016  What Year? 0 points  What month? 0 points  What time? 0 points  Count back from 20 0 points  Months in reverse 0 points  Repeat phrase 0 points  Total Score 0    Immunization History  Administered Date(s) Administered  . Pneumococcal Conjugate-13 11/25/2016  . Td 05/18/2009  . Zoster 10/15/2011   Screening Tests Health Maintenance  Topic Date Due  . COLONOSCOPY  08/23/2000  . INFLUENZA VACCINE  05/17/2035 (Originally 03/19/2017)  . MAMMOGRAM  12/07/2016  .  PNA vac Low Risk Adult (2 of 2 - PPSV23) 11/25/2017  . TETANUS/TDAP  05/19/2019  . DEXA SCAN  Completed  . Hepatitis C Screening  Completed      Plan:    During the course of the visit the patient was educated and counseled about the following appropriate screening and preventive services:   Vaccines to include Prevnar 13 given today.   Cardiovascular Disease-lipid panel ordered  Colorectal cancer screening-ordered today.  Bone density screening-ordered today.  Diabetes screening-cmp ordered  Glaucoma screening-pt has annual eye exams  Mammography/PAP-pap not indicated. Mammogram ordered.  Nutrition counseling   Smoking cessation counseling- started chantix. Follow up in one month. Discussed slow taper to completely stop in first month. Sent referral to have CT low dose screen.   Marland KitchenArbie Cookey was seen today for medicare wellness.  Diagnoses and all orders for this visit:  Pure hypercholesterolemia -      Lipid panel  Smoker  Screening for diabetes mellitus -     COMPLETE METABOLIC PANEL WITH GFR  Colon cancer screening -     Ambulatory referral to Gastroenterology  Need for pneumococcal vaccination -     Pneumococcal conjugate vaccine 13-valent  Melanoma of skin (Olivia Frank)  Visit for screening mammogram -     MM DIGITAL SCREENING BILATERAL; Future  Osteoporosis screening -     DG Bone Density; Future  Postmenopausal -     DG Bone Density; Future  Encounter for screening for lung cancer  Other orders -     lisinopril (PRINIVIL,ZESTRIL) 5 MG tablet; Take 1 tablet (5 mg total) by mouth daily. -     varenicline (CHANTIX STARTING MONTH PAK) 0.5 MG X 11 & 1 MG X 42 tablet; Take one 0.5mg  tablet by mouth once daily for 3 days, then increase to one 0.5mg  tablet twice daily for 3 days, then increase to one 1mg  tablet twice daily.   Added lisinopril 5mg  to control BP. Recheck in one month. Discussed side effects.   Patient Instructions (the written plan) was given to the patient.   Olivia Planas, PA-C

## 2016-11-26 DIAGNOSIS — E7800 Pure hypercholesterolemia, unspecified: Secondary | ICD-10-CM | POA: Diagnosis not present

## 2016-11-26 DIAGNOSIS — Z131 Encounter for screening for diabetes mellitus: Secondary | ICD-10-CM | POA: Diagnosis not present

## 2016-11-26 LAB — COMPLETE METABOLIC PANEL WITH GFR
ALBUMIN: 4.1 g/dL (ref 3.6–5.1)
ALK PHOS: 86 U/L (ref 33–130)
ALT: 48 U/L — ABNORMAL HIGH (ref 6–29)
AST: 30 U/L (ref 10–35)
BUN: 15 mg/dL (ref 7–25)
CO2: 25 mmol/L (ref 20–31)
Calcium: 8.9 mg/dL (ref 8.6–10.4)
Chloride: 107 mmol/L (ref 98–110)
Creat: 0.74 mg/dL (ref 0.50–0.99)
GFR, EST NON AFRICAN AMERICAN: 85 mL/min (ref >=60)
GFR, Est African American: >89 mL/min (ref >=60)
GLUCOSE: 102 mg/dL — AB (ref 65–99)
POTASSIUM: 4.2 mmol/L (ref 3.5–5.3)
SODIUM: 142 mmol/L (ref 135–146)
Total Bilirubin: 0.8 mg/dL (ref 0.2–1.2)
Total Protein: 6.5 g/dL (ref 6.1–8.1)

## 2016-11-26 LAB — LIPID PANEL
Cholesterol: 206 mg/dL — ABNORMAL HIGH (ref <200)
HDL: 58 mg/dL (ref >50)
LDL CALC: 90 mg/dL (ref <100)
TRIGLYCERIDES: 289 mg/dL — AB (ref <150)
Total CHOL/HDL Ratio: 3.6 Ratio (ref <5.0)
VLDL: 58 mg/dL — AB (ref <30)

## 2016-11-27 ENCOUNTER — Telehealth: Payer: Self-pay | Admitting: Acute Care

## 2016-11-28 NOTE — Telephone Encounter (Signed)
Will forward to the lung screening pool 

## 2016-12-02 ENCOUNTER — Other Ambulatory Visit: Payer: Self-pay | Admitting: Acute Care

## 2016-12-02 DIAGNOSIS — F17210 Nicotine dependence, cigarettes, uncomplicated: Principal | ICD-10-CM

## 2016-12-02 NOTE — Telephone Encounter (Signed)
LMTC x 1  

## 2016-12-06 ENCOUNTER — Telehealth: Payer: Self-pay | Admitting: *Deleted

## 2016-12-06 MED ORDER — LOSARTAN POTASSIUM 50 MG PO TABS
50.0000 mg | ORAL_TABLET | Freq: Every day | ORAL | 1 refills | Status: DC
Start: 2016-12-06 — End: 2016-12-23

## 2016-12-06 NOTE — Telephone Encounter (Signed)
Pt notified of rx. 

## 2016-12-06 NOTE — Telephone Encounter (Signed)
Sent losartan to pharmacy 50mg  daily.

## 2016-12-06 NOTE — Telephone Encounter (Signed)
Pt left vm wanting you to know that she has developed a dry cough on the Lisinopril.  She is going out of town tomorrow for a week and wanted to know if you could switch her to something else before then.  Please advise.

## 2016-12-11 NOTE — Telephone Encounter (Signed)
Spoke with pt and scheduled for [facility].  CT ordered Nothing further needed

## 2016-12-19 DIAGNOSIS — Z1211 Encounter for screening for malignant neoplasm of colon: Secondary | ICD-10-CM | POA: Diagnosis not present

## 2016-12-19 DIAGNOSIS — K621 Rectal polyp: Secondary | ICD-10-CM | POA: Diagnosis not present

## 2016-12-19 DIAGNOSIS — K5730 Diverticulosis of large intestine without perforation or abscess without bleeding: Secondary | ICD-10-CM | POA: Diagnosis not present

## 2016-12-19 LAB — HM COLONOSCOPY

## 2016-12-20 ENCOUNTER — Ambulatory Visit: Payer: Medicare Other

## 2016-12-20 ENCOUNTER — Encounter: Payer: Medicare Other | Admitting: Acute Care

## 2016-12-23 ENCOUNTER — Ambulatory Visit (INDEPENDENT_AMBULATORY_CARE_PROVIDER_SITE_OTHER)
Admission: RE | Admit: 2016-12-23 | Discharge: 2016-12-23 | Disposition: A | Discharge status: Home / Self Care | Payer: Medicare Other | Source: Ambulatory Visit | Attending: Acute Care | Admitting: Acute Care

## 2016-12-23 ENCOUNTER — Encounter: Payer: Self-pay | Admitting: Physician Assistant

## 2016-12-23 ENCOUNTER — Ambulatory Visit (INDEPENDENT_AMBULATORY_CARE_PROVIDER_SITE_OTHER): Payer: Medicare Other | Admitting: Physician Assistant

## 2016-12-23 ENCOUNTER — Encounter: Payer: Self-pay | Admitting: Acute Care

## 2016-12-23 ENCOUNTER — Ambulatory Visit (INDEPENDENT_AMBULATORY_CARE_PROVIDER_SITE_OTHER): Payer: Medicare Other | Admitting: Acute Care

## 2016-12-23 VITALS — BP 131/67 | HR 80 | Ht 65.0 in | Wt 170.0 lb

## 2016-12-23 DIAGNOSIS — F17210 Nicotine dependence, cigarettes, uncomplicated: Secondary | ICD-10-CM

## 2016-12-23 DIAGNOSIS — Z720 Tobacco use: Secondary | ICD-10-CM | POA: Diagnosis not present

## 2016-12-23 DIAGNOSIS — Z87891 Personal history of nicotine dependence: Secondary | ICD-10-CM | POA: Diagnosis not present

## 2016-12-23 DIAGNOSIS — I10 Essential (primary) hypertension: Secondary | ICD-10-CM | POA: Insufficient documentation

## 2016-12-23 DIAGNOSIS — R748 Abnormal levels of other serum enzymes: Secondary | ICD-10-CM | POA: Diagnosis not present

## 2016-12-23 DIAGNOSIS — Z716 Tobacco abuse counseling: Secondary | ICD-10-CM | POA: Diagnosis not present

## 2016-12-23 DIAGNOSIS — K635 Polyp of colon: Secondary | ICD-10-CM | POA: Diagnosis not present

## 2016-12-23 DIAGNOSIS — E7800 Pure hypercholesterolemia, unspecified: Secondary | ICD-10-CM

## 2016-12-23 MED ORDER — LOSARTAN POTASSIUM 50 MG PO TABS: 50.0000 mg | ORAL_TABLET | Freq: Every day | ORAL | 4 refills | Status: DC

## 2016-12-23 MED ORDER — ATORVASTATIN CALCIUM 40 MG PO TABS: 40.0000 mg | ORAL_TABLET | Freq: Every day | ORAL | 4 refills | Status: DC

## 2016-12-23 MED ORDER — BUPROPION HCL ER (SR) 150 MG PO TB12: 150.0000 mg | ORAL_TABLET | Freq: Two times a day (BID) | ORAL | 1 refills | Status: DC

## 2016-12-23 NOTE — Progress Notes (Signed)
   Subjective:    Patient ID: Olivia Frank, female    DOB: September 25, 1950, 66 y.o.   MRN: [id number]  HPI  Pt is a 66 yo female who presents to the clinic for 4 week follow up on smoking cessation and BP.   Chantix was too expensive at 400 dollars. She has cut back on her own from 1 pack a day to 1/2 pack a day. She feels like she needs something else to help her. She is scheduled for CT low dose screening today for lung cancer.   She is taking cozaar without any problems or concerns. She denies any palpitations, headaches, SOB, dizziness, CP.   She did have some elevated liver enzyme on labs. She has stopped tylenol and limited alcohol to see if can get numbers down.    Review of Systems  All other systems reviewed and are negative.      Objective:   Physical Exam  Constitutional: She is oriented to person, place, and time. She appears well-developed and well-nourished.  HENT:  Head: Normocephalic and atraumatic.  Cardiovascular: Normal rate, regular rhythm and normal heart sounds.   Pulmonary/Chest: Effort normal and breath sounds normal. She has no wheezes.  Neurological: She is alert and oriented to person, place, and time.  Psychiatric: She has a normal mood and affect. Her behavior is normal.          Assessment & Plan:  Marland KitchenMarland KitchenDiagnoses and all orders for this visit:  Encounter for smoking cessation counseling -     buPROPion (WELLBUTRIN SR) 150 MG 12 hr tablet; Take 1 tablet (150 mg total) by mouth 2 (two) times daily.  Elevated liver enzymes -     Hepatic function panel  Hyperplastic colonic polyp, unspecified part of colon  Essential hypertension -     losartan (COZAAR) 50 MG tablet; Take 1 tablet (50 mg total) by mouth daily.  Pure hypercholesterolemia -     atorvastatin (LIPITOR) 40 MG tablet; Take 1 tablet (40 mg total) by mouth daily.   Started wellbutrin for smoking cessation. Discussed to stop over next month. Follow up in 2 months.   Will check hepatic  function.

## 2016-12-23 NOTE — Progress Notes (Signed)
Shared Decision Making Visit Lung Cancer Screening Program [id number])   Eligibility:  Age 66 y.o.  Pack Years Smoking History Calculation 51 pack year smoking history (# packs/per year x # years smoked)  Recent History of coughing up blood  no  Unexplained weight loss? no ( >Than 15 pounds within the last 6 months )  Prior History Lung / other cancer no (Diagnosis within the last 5 years already requiring surveillance chest CT Scans).  Smoking Status Current Smoker  Former Smokers: Years since quit: Not applicable  Quit Date: Not applicable  Visit Components:  Discussion included one or more decision making aids. yes  Discussion included risk/benefits of screening. yes  Discussion included potential follow up diagnostic testing for abnormal scans. yes  Discussion included meaning and risk of over diagnosis. yes  Discussion included meaning and risk of False Positives. yes  Discussion included meaning of total radiation exposure. yes  Counseling Included:  Importance of adherence to annual lung cancer LDCT screening. yes  Impact of comorbidities on ability to participate in the program. yes  Ability and willingness to under diagnostic treatment. yes  Smoking Cessation Counseling:  Current Smokers:   Discussed importance of smoking cessation. yes  Information about tobacco cessation classes and interventions provided to patient. yes  Patient provided with "ticket" for LDCT Scan. yes  Symptomatic Patient. no  Counseling  Diagnosis Code: Tobacco Use Z72.0  Asymptomatic Patient yes  Counseling (Intermediate counseling: > three minutes counseling) G1829  Former Smokers:   Discussed the importance of maintaining cigarette abstinence. yes  Diagnosis Code: Personal History of Nicotine Dependence. H37.169  Information about tobacco cessation classes and interventions provided to patient. Yes  Patient provided with "ticket" for LDCT Scan. yes  Written Order  for Lung Cancer Screening with LDCT placed in Epic. Yes (CT Chest Lung Cancer Screening Low Dose W/O CM) [id number] Z12.2-Screening of respiratory organs Z87.891-Personal history of nicotine dependence   I have spent 25 minutes of face to face time with Olivia Frank discussing the risks and benefits of lung cancer screening. We viewed a power point together that explained in detail the above noted topics. We paused at intervals to allow for questions to be asked and answered to ensure understanding.We discussed that the single most powerful action that she can take to decrease her risk of developing lung cancer is to quit smoking. We discussed whether or not she is ready to commit to setting a quit date. She is currently not ready to set a quit date. We discussed options for tools to aid in quitting smoking including nicotine replacement therapy, non-nicotine medications, support groups, Quit Smart classes, and behavior modification. We discussed that often times setting smaller, more achievable goals, such as eliminating 1 cigarette a day for a week and then 2 cigarettes a day for a week can be helpful in slowly decreasing the number of cigarettes smoked. This allows for a sense of accomplishment as well as providing a clinical benefit. I gave her the " Be Stronger Than Your Excuses" card with contact information for community resources, classes, free nicotine replacement therapy, and access to mobile apps, text messaging, and on-line smoking cessation help. I have also given her my card and contact information in the event she needs to contact me. We discussed the time and location of the scan, and that either Doroteo Glassman RN or I will call with the results within 24-48 hours of receiving them. I have offered Olivia Frank    a copy of  the power point we viewed  as a resource in the event they need reinforcement of the concepts we discussed today in the office. The patient verbalized understanding of all of   the above and had no further questions upon leaving the office. They have my contact information in the event they have any further questions.  I spent 4  minutes counseling on smoking cessation and the health risks of continued tobacco abuse.  I explained to the patient that there has been a high incidence of coronary artery disease noted on these exams. I explained that this is a non-gated exam therefore degree or severity cannot be determined. This patient is currently on statin therapy. I have asked the patient to follow-up with their PCP regarding any incidental finding of coronary artery disease and management with diet or medication as their PCP  feels is clinically indicated. The patient verbalized understanding of the above and had no further questions upon completion of the visit.     Olivia Spatz, NP 12/23/2016

## 2016-12-26 ENCOUNTER — Encounter: Payer: Self-pay | Admitting: Physician Assistant

## 2016-12-26 DIAGNOSIS — I700 Atherosclerosis of aorta: Secondary | ICD-10-CM | POA: Insufficient documentation

## 2016-12-26 DIAGNOSIS — J439 Emphysema, unspecified: Secondary | ICD-10-CM | POA: Insufficient documentation

## 2016-12-26 DIAGNOSIS — R911 Solitary pulmonary nodule: Secondary | ICD-10-CM | POA: Insufficient documentation

## 2016-12-26 DIAGNOSIS — I2510 Atherosclerotic heart disease of native coronary artery without angina pectoris: Secondary | ICD-10-CM | POA: Insufficient documentation

## 2016-12-27 ENCOUNTER — Other Ambulatory Visit: Payer: Self-pay | Admitting: Acute Care

## 2016-12-27 DIAGNOSIS — F17210 Nicotine dependence, cigarettes, uncomplicated: Principal | ICD-10-CM

## 2016-12-31 ENCOUNTER — Ambulatory Visit (INDEPENDENT_AMBULATORY_CARE_PROVIDER_SITE_OTHER): Payer: Medicare Other

## 2016-12-31 DIAGNOSIS — E2839 Other primary ovarian failure: Secondary | ICD-10-CM | POA: Diagnosis not present

## 2016-12-31 DIAGNOSIS — Z13820 Encounter for screening for osteoporosis: Secondary | ICD-10-CM

## 2016-12-31 DIAGNOSIS — Z1231 Encounter for screening mammogram for malignant neoplasm of breast: Secondary | ICD-10-CM

## 2016-12-31 DIAGNOSIS — Z780 Asymptomatic menopausal state: Secondary | ICD-10-CM

## 2016-12-31 NOTE — Progress Notes (Signed)
Call pt: great news. Normal bone density. Continue on vitamin D and calcium for prevention.

## 2017-01-01 NOTE — Progress Notes (Signed)
Call pt: normal mammogram. Follow up in one year.

## 2017-01-17 ENCOUNTER — Encounter: Payer: Self-pay | Admitting: Physician Assistant

## 2017-01-31 ENCOUNTER — Telehealth: Payer: Self-pay | Admitting: Family Medicine

## 2017-01-31 NOTE — Telephone Encounter (Signed)
OK for letter for [employer].

## 2017-01-31 NOTE — Telephone Encounter (Signed)
Olivia Frank called and stated she got summoned to [employer] and would like to know if you can write something for her to get exempt from going since she is caring for her husband and son and they can not be left alone for long periods of time. Thanks

## 2017-02-03 NOTE — Telephone Encounter (Signed)
Pt informed that letter is up front.Olivia Frank [place]

## 2017-02-24 ENCOUNTER — Ambulatory Visit (INDEPENDENT_AMBULATORY_CARE_PROVIDER_SITE_OTHER): Payer: Medicare Other | Admitting: Family Medicine

## 2017-02-24 ENCOUNTER — Encounter: Payer: Self-pay | Admitting: Family Medicine

## 2017-02-24 VITALS — BP 124/68 | HR 93 | Wt 170.0 lb

## 2017-02-24 DIAGNOSIS — R21 Rash and other nonspecific skin eruption: Secondary | ICD-10-CM | POA: Diagnosis not present

## 2017-02-24 MED ORDER — CLOTRIMAZOLE-BETAMETHASONE 1-0.05 % EX CREA: 1.0000 "application " | TOPICAL_CREAM | Freq: Two times a day (BID) | CUTANEOUS | 0 refills | Status: DC

## 2017-02-24 MED ORDER — FLUCONAZOLE 150 MG PO TABS: 150.0000 mg | ORAL_TABLET | ORAL | 0 refills | Status: DC

## 2017-02-24 NOTE — Addendum Note (Signed)
Addended by: Teddy Spike on: 02/24/2017 02:43 PM   Modules accepted: Orders

## 2017-02-24 NOTE — Progress Notes (Signed)
Subjective:    Patient ID: Olivia Frank, female    DOB: 1951-08-06, 66 y.o.   MRN: [id number]  HPI  66 year old female comes in today complaining of a rash that started about 5 days ago. She denies any new soaps lotions or detergents etc. The only thing that was different was she was wearing a nicotine patch on her left upper outer arm. But she says she never actually has developed a rash on that arm. It's mostly behind the knees over the elbow creases underneath the breast along the groin creases and around the neck. It mostly using topical Benadryl and even barrier ointments without any significant relief. No fevers chills or sweats. She did take off her nicotine patch about 2 days ago and has not noticed any improvement in the rash. He says the rash is extremely itchy.   Review of Systems  BP 124/68   Pulse 93   Wt 170 lb (77.1 kg)   BMI 28.29 kg/m     Allergies  Allergen Reactions  . Influenza Vaccines Nausea And Vomiting    Nausea and vomiting after shots. Per pt does not want to take flu shot.   . Morphine And Related Hives and Itching  . Ace Inhibitors     cough    Past Medical History:  Diagnosis Date  . COPD (chronic obstructive pulmonary disease) ([place])   . Melanoma [facility])     Past Surgical History:  Procedure Laterality Date  . ABDOMINAL HYSTERECTOMY    . APPENDECTOMY    . OVARIAN CYST REMOVAL      Social History   Social History  . Marital status: Married    Spouse name: N/A  . Number of children: N/A  . Years of education: N/A   Occupational History  . Not on file.   Social History Main Topics  . Smoking status: Current Every Day Smoker    Packs/day: 1.00    Years: 51.00    Types: Cigarettes  . Smokeless tobacco: Never Used  . Alcohol use No  . Drug use: No  . Sexual activity: Not on file   Other Topics Concern  . Not on file   Social History Narrative  . No narrative on file    Family History  Problem Relation Age of Onset  . Cancer  Mother        breast  . Hyperlipidemia Mother   . Heart disease Father   . Cancer Sister   . Seizures Sister   . Cancer Maternal Grandmother        breast  . Cancer Maternal Grandfather        colon    Outpatient Encounter Prescriptions as of 02/24/2017  Medication Sig  . atorvastatin (LIPITOR) 40 MG tablet Take 1 tablet (40 mg total) by mouth daily.  [name] buPROPion (WELLBUTRIN SR) 150 MG 12 hr tablet Take 1 tablet (150 mg total) by mouth 2 (two) times daily.  [name] losartan (COZAAR) 50 MG tablet Take 1 tablet (50 mg total) by mouth daily.  . Omega-3 Fatty Acids (FISH OIL) 1000 MG CAPS Take by mouth.  . vitamin B-12 (CYANOCOBALAMIN) 1000 MCG tablet Take 1,000 mcg by mouth daily.  [name] VITAMIN D, CHOLECALCIFEROL, PO Take 1,000 Units by mouth daily.  . clotrimazole-betamethasone (LOTRISONE) cream Apply 1 application topically 2 (two) times daily.  . fluconazole (DIFLUCAN) 150 MG tablet Take 1 tablet (150 mg total) by mouth every other day.   No facility-administered encounter medications on  file as of 02/24/2017.           Objective:   Physical Exam  Constitutional: She is oriented to person, place, and time. She appears well-developed and well-nourished.  HENT:  Head: Normocephalic and atraumatic.  Eyes: Conjunctivae and EOM are normal.  Cardiovascular: Normal rate.   Pulmonary/Chest: Effort normal.  Neurological: She is alert and oriented to person, place, and time.  Skin: Skin is dry. No pallor.  She has erythematous large plaque and papules over the antecubital fossa bilaterally, behind the knees bilaterally around the neck, on her upper chest, underneath both breasts, along the groin creases. It does not appear to be wet or macerated but in fact has almost more of a scale that is sparse and fine. No drainage.  Psychiatric: She has a normal mood and affect. Her behavior is normal.  Vitals reviewed.         Assessment & Plan:  Papular rash-unclear etiology. Suspect that this  could've originated with a fungal infection and could be an id reaction vs erythrasma. Since it is affecting the tissue underneath the breast folds and in the groin creases will treat with oral Diflucan as well as a topical combination antifungal/steroid cream.

## 2017-02-26 LAB — FUNGAL STAIN

## 2017-03-05 DIAGNOSIS — D485 Neoplasm of uncertain behavior of skin: Secondary | ICD-10-CM | POA: Diagnosis not present

## 2017-03-05 DIAGNOSIS — L570 Actinic keratosis: Secondary | ICD-10-CM | POA: Diagnosis not present

## 2017-03-05 DIAGNOSIS — Z85828 Personal history of other malignant neoplasm of skin: Secondary | ICD-10-CM | POA: Diagnosis not present

## 2017-03-05 DIAGNOSIS — D1801 Hemangioma of skin and subcutaneous tissue: Secondary | ICD-10-CM | POA: Diagnosis not present

## 2017-03-05 DIAGNOSIS — L821 Other seborrheic keratosis: Secondary | ICD-10-CM | POA: Diagnosis not present

## 2017-03-05 DIAGNOSIS — L814 Other melanin hyperpigmentation: Secondary | ICD-10-CM | POA: Diagnosis not present

## 2017-03-05 DIAGNOSIS — B079 Viral wart, unspecified: Secondary | ICD-10-CM | POA: Diagnosis not present

## 2017-03-05 DIAGNOSIS — L728 Other follicular cysts of the skin and subcutaneous tissue: Secondary | ICD-10-CM | POA: Diagnosis not present

## 2017-03-05 DIAGNOSIS — L309 Dermatitis, unspecified: Secondary | ICD-10-CM | POA: Diagnosis not present

## 2017-03-05 DIAGNOSIS — R21 Rash and other nonspecific skin eruption: Secondary | ICD-10-CM | POA: Diagnosis not present

## 2017-03-11 IMAGING — MR MR CERVICAL SPINE W/O CM
5 series · 33 of 48 positions shown · non-contrast
Comparison: Cervical spine radiographs 11/06/2015.

CLINICAL DATA: Neck pain. Left upper extremity pain to the elbow
for 2 weeks with progression over the last 7 days. Cervical disc
disorder. Left C5 radiculopathy.

EXAM:
MRI CERVICAL SPINE WITHOUT CONTRAST
TECHNIQUE: Multiplanar, multisequence MR imaging of the cervical spine was
performed. No intravenous contrast was administered.

[Series 3: T2 · sagittal · 3.0mm · 0.56mm/px · 7 of 15 slices shown (1 of 2)]
[im 1/15]
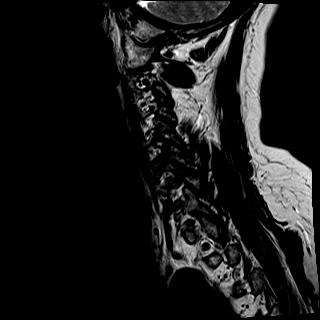
[im 3/15]
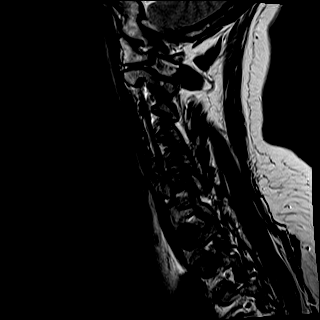
[im 5/15]
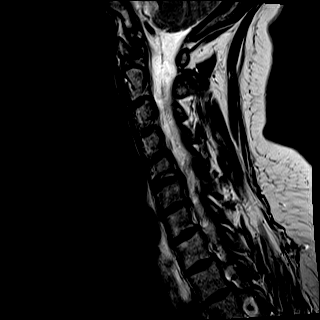
[im 8/15]
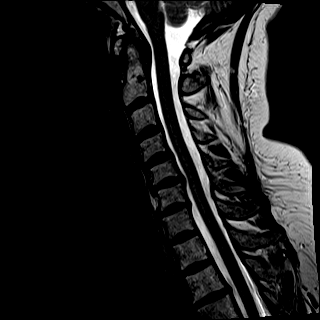
[im 10/15]
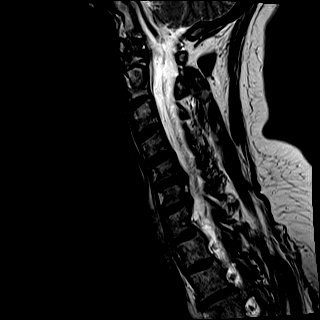
[im 12/15]
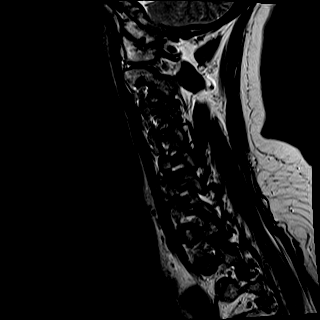
[im 15/15]
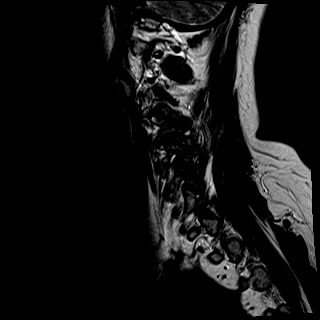

[Series 4: T1 · sagittal · 3.0mm · 0.70mm/px · 7 of 15 slices shown]
[im 1/15]
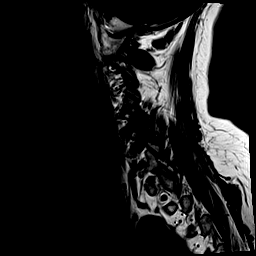
[im 3/15]
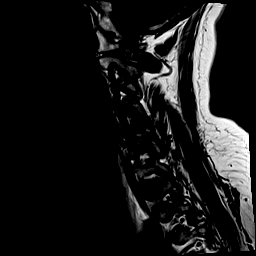
[im 5/15]
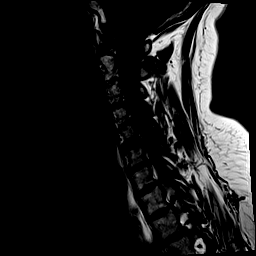
[im 8/15]
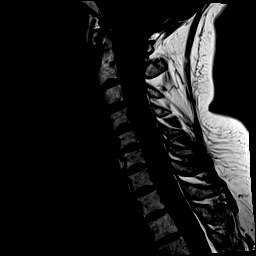
[im 10/15]
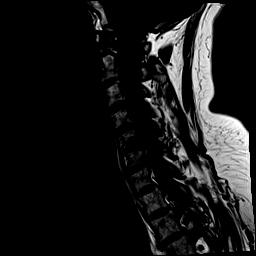
[im 12/15]
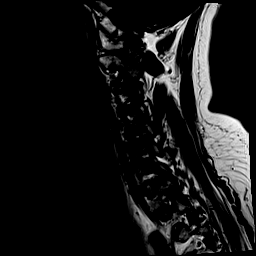
[im 15/15]
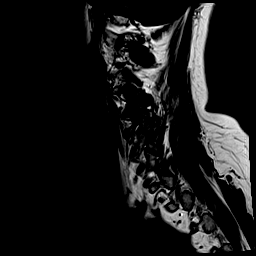

[Series 5: STIR · sagittal · 3.0mm · 0.35mm/px · 8 of 15 slices shown]
[im 1/15]
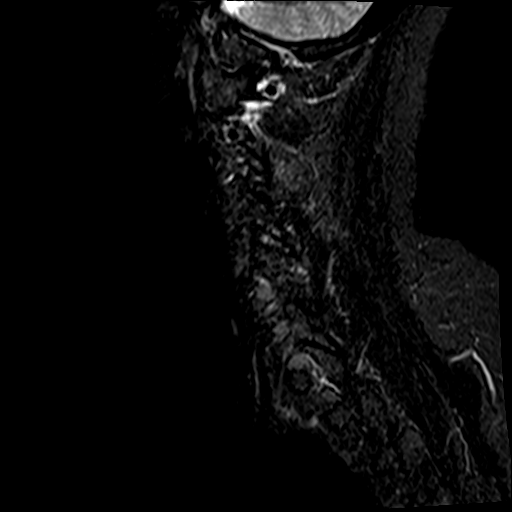
[im 3/15]
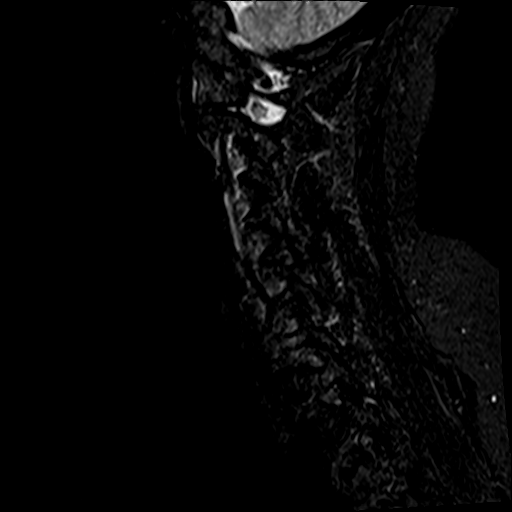
[im 5/15]
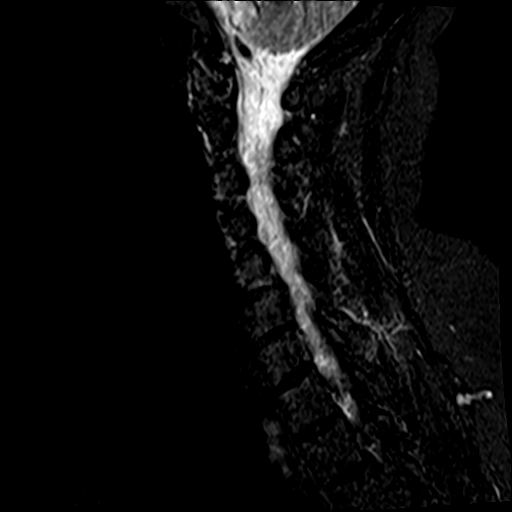
[im 7/15]
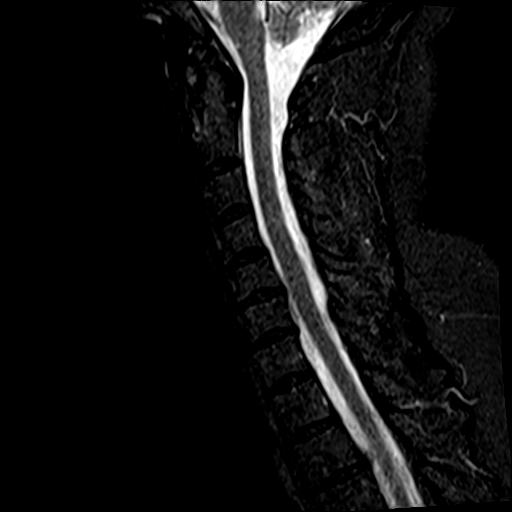
[im 9/15]
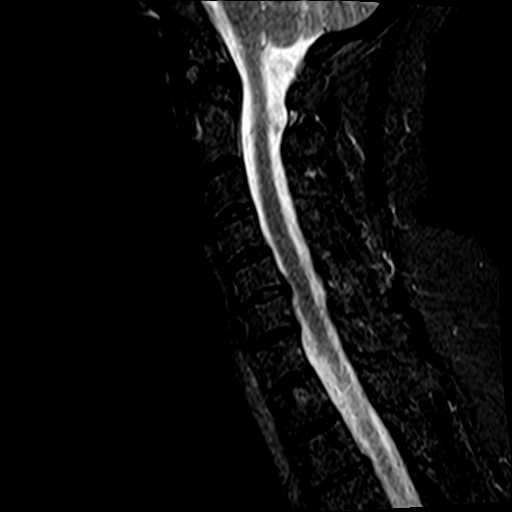
[im 11/15]
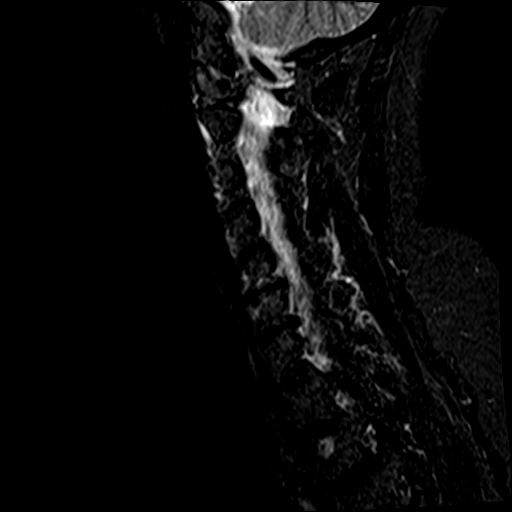
[im 13/15]
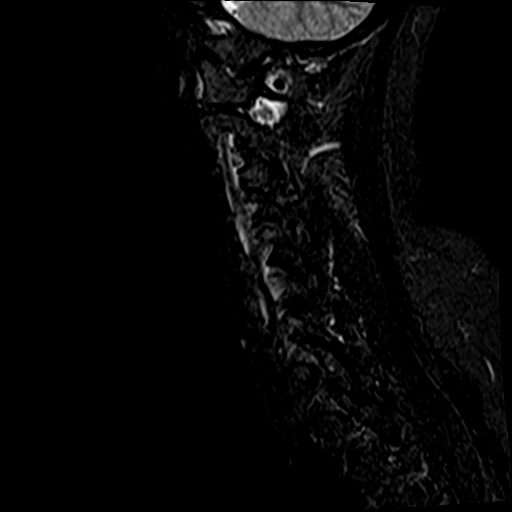
[im 15/15]
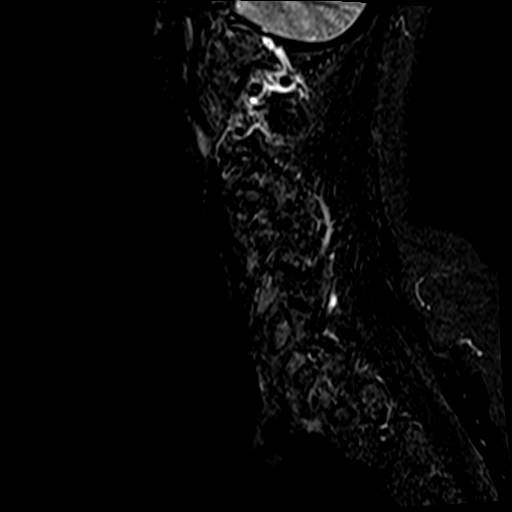

[Series 6: T2 · axial · 3.0mm · 0.62mm/px · z∈[-90,-2]mm · 9 of 25 slices shown (2 of 2)]
[im 1/25]
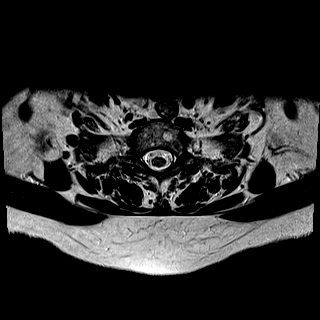
[im 5/25]
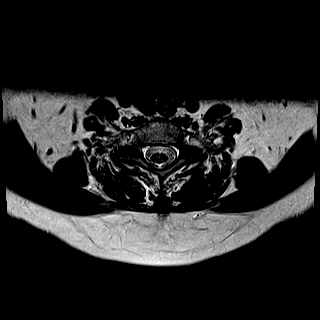
[im 9/25]
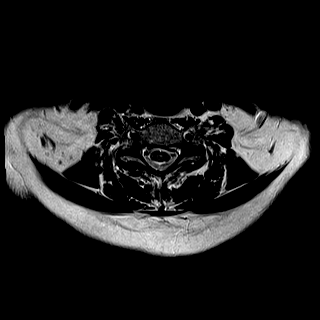
[im 11/25]
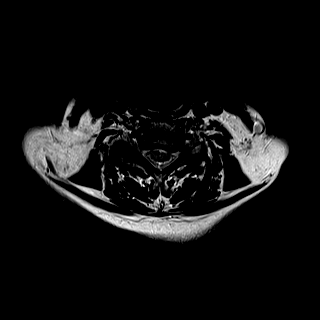
[im 13/25]
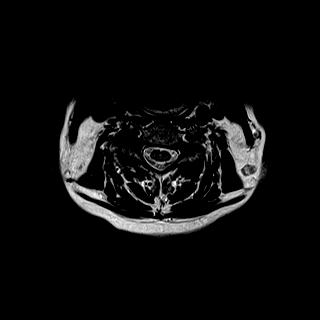
[im 15/25]
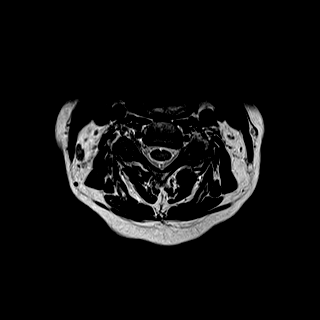
[im 17/25]
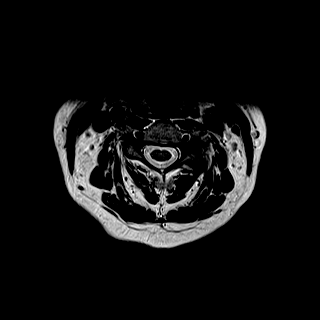
[im 21/25]
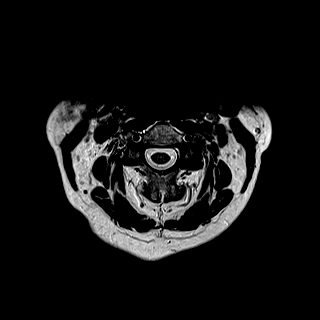
[im 25/25]
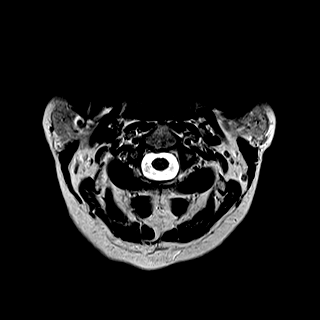

[Series 7: mpgr ax · axial · 3.0mm · 0.35mm/px · z∈[-79,-64]mm · 2 of 25 slices shown]
[im 1/25]
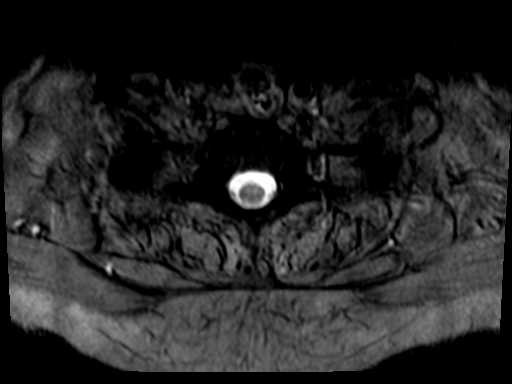
[im 5/25]
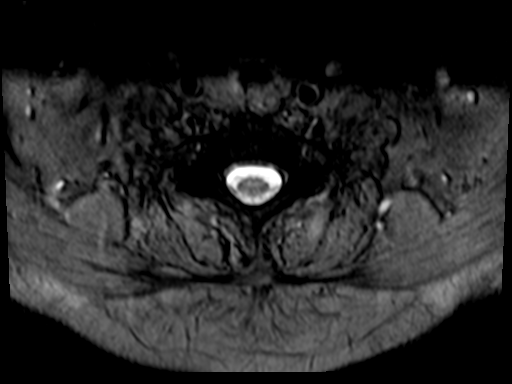

[33 of 48 positions shown; findings below may reference images not displayed]

FINDINGS: Normal signal is present in the cervical and upper thoracic spinal
cord to the lowest imaged level, T2-3. Marrow signal and vertebral
body heights are normal. There straightening of the normal cervical
lordosis. Slight anterolisthesis is present at C4-5. Alignment is
otherwise anatomic.

Craniocervical junction is within normal limits. Flow is present in
the vertebral arteries but laterally.

C2-3:  Negative.

C3-4: Asymmetric left-sided facet hypertrophy at uncovertebral
spurring lead to mild left foraminal stenosis.

C4-5: Asymmetric left-sided uncovertebral and left greater than
right facet hypertrophy leads to mild to moderate left foraminal
stenosis. The right foramen is patent.

C5-6: A leftward disc osteophyte complex partially effaces ventral
CSF. Mild to moderate left and mild right foraminal narrowing is due
to uncovertebral disease.

C6-7: Asymmetric left-sided uncovertebral spurring leads to moderate
left foraminal stenosis. The right foramen and central canal are
patent.

C7-T1:  Negative.
IMPRESSION: 1. Multilevel left-sided foraminal disease as described.
2. Mild to moderate left foraminal stenosis at C4-5 due to
uncovertebral and facet hypertrophy could certainly affect the C5
nerve root.
3. Mild to moderate left 5 6 and moderate left C6-7 foraminal
stenosis.
4. Mild right foraminal narrowing is also present at C6-7.

## 2017-06-23 ENCOUNTER — Ambulatory Visit (INDEPENDENT_AMBULATORY_CARE_PROVIDER_SITE_OTHER): Payer: Medicare Other | Admitting: Osteopathic Medicine

## 2017-06-23 ENCOUNTER — Encounter: Payer: Self-pay | Admitting: Osteopathic Medicine

## 2017-06-23 VITALS — BP 133/76 | HR 92 | Temp 97.8°F | Ht 65.0 in | Wt 174.2 lb

## 2017-06-23 DIAGNOSIS — Z23 Encounter for immunization: Secondary | ICD-10-CM | POA: Diagnosis not present

## 2017-06-23 DIAGNOSIS — H6122 Impacted cerumen, left ear: Secondary | ICD-10-CM

## 2017-06-23 NOTE — Progress Notes (Signed)
HPI: Olivia Frank is a 66 y.o. female with PMH  has a past medical history of COPD (chronic obstructive pulmonary disease) ([place]) and Melanoma ([place]).  who presents to [facility] today, 06/23/17,  for chief complaint of:  Chief Complaint  Patient presents with  . Ear Pain    Hx cerumen impactions. Fullness and hearing diminished today on L. Tried Debrox at home w/o effect. No fever/chils, no sore throat, no cough.    Past medical, surgical, social and family history reviewed:  Patient Active Problem List   Diagnosis Date Noted  . Pulmonary nodule, right 12/26/2016  . Emphysema lung ([place]) 12/26/2016  . Coronary atherosclerosis 12/26/2016  . Aortic atherosclerosis ([place]) 12/26/2016  . Hyperplastic colon polyp 12/23/2016  . Essential hypertension 12/23/2016  . Basal cell carcinoma of left lower leg 03/22/2016  . Cervical spondylosis with radiculopathy 11/06/2015  . Hyperlipidemia 06/06/2015  . Hypertriglyceridemia 06/06/2015  . Elevated blood pressure 05/17/2015  . Stress at home 05/17/2015  . Cerumen impaction 05/17/2015  . Primary osteoarthritis of both knees 11/29/2014  . Fracture of radial head, right, closed 10/08/2013  . Left wrist sprain 10/08/2013  . Low back pain 04/14/2012  . FELON 05/27/2011  . CHRONIC OBSTRUCTIVE PULMONARY DISEASE, ACUTE 10/04/2010  . PNEUMONIA, ORGANISM UNSPECIFIED 09/20/2010  . SINUSITIS 09/22/2009  . MAMMOGRAM, ABNORMAL 07/18/2009  . HYPERLIPIDEMIA 05/18/2009  . Postmenopausal 05/18/2009  . Melanoma of skin ([place]) 04/20/2009  . EUSTACHIAN TUBE DYSFUNCTION, RIGHT 04/20/2009    Past Surgical History:  Procedure Laterality Date  . ABDOMINAL HYSTERECTOMY    . APPENDECTOMY    . OVARIAN CYST REMOVAL      Social History   Tobacco Use  . Smoking status: Current Every Day Smoker    Packs/day: 1.00    Years: 51.00    Pack years: 51.00    Types: Cigarettes  . Smokeless tobacco: Never Used  Substance Use  Topics  . Alcohol use: No    Family History  Problem Relation Age of Onset  . Cancer Mother        breast  . Hyperlipidemia Mother   . Heart disease Father   . Cancer Sister   . Seizures Sister   . Cancer Maternal Grandmother        breast  . Cancer Maternal Grandfather        colon     Current medication list and allergy/intolerance information reviewed:    Current Outpatient Medications  Medication Sig Dispense Refill  . atorvastatin (LIPITOR) 40 MG tablet Take 1 tablet (40 mg total) by mouth daily. 90 tablet 4  . buPROPion (WELLBUTRIN SR) 150 MG 12 hr tablet Take 1 tablet (150 mg total) by mouth 2 (two) times daily. 60 tablet 1  . clotrimazole-betamethasone (LOTRISONE) cream Apply 1 application topically 2 (two) times daily. 30 g 0  . fluconazole (DIFLUCAN) 150 MG tablet Take 1 tablet (150 mg total) by mouth every other day. 3 tablet 0  . losartan (COZAAR) 50 MG tablet Take 1 tablet (50 mg total) by mouth daily. 90 tablet 4  . Omega-3 Fatty Acids (FISH OIL) 1000 MG CAPS Take by mouth.    . vitamin B-12 (CYANOCOBALAMIN) 1000 MCG tablet Take 1,000 mcg by mouth daily.    [name] VITAMIN D, CHOLECALCIFEROL, PO Take 1,000 Units by mouth daily.     No current facility-administered medications for this visit.     Allergies  Allergen Reactions  . Influenza Vaccines Nausea And Vomiting  Nausea and vomiting after shots. Per pt does not want to take flu shot.   . Morphine And Related Hives and Itching  . Ace Inhibitors     cough      Review of Systems:  Constitutional:  No  fever, no chills, No recent illness, No unintentional weight changes.   HEENT: No  headache, no vision change, +hearing change, No sore throat, No  sinus pressure  Cardiac: No  chest pain  Respiratory:  No  shortness of breath  Neurologic: No  weakness, No  dizziness,     Exam:  BP 133/76   Pulse 92   Temp 97.8 F (36.6 C)   Ht 5\' 5"  (1.651 m)   Wt 174 lb 3.2 oz (79 kg)   SpO2 97%   BMI 28.99  kg/m   Constitutional: VS see above. General Appearance: alert, well-developed, well-nourished, NAD  Eyes: Normal lids and conjunctive, non-icteric sclera  Ears, Nose, Mouth, Throat: MMM, Normal external inspection ears/nares/mouth/lips/gums. TM initially obscured on L but after cerumen removal apears normal bilaterally. Pharynx/tonsils no erythema, no exudate. Nasal mucosa normal.   Neck: No masses, trachea midline. No thyroid enlargement. No tenderness/mass appreciated. No lymphadenopathy Respiratory: Normal respiratory effort.     Indication: Cerumen impaction of the ear(s) Medical necessity statement: On physical examination, cerumen impairs clinically significant portions of the external auditory canal on L, and tympanic membrane. Noted obstructive, copious cerumen that cannot be removed without magnification and instrumentations requiring physician skills Consent: Discussed benefits and risks of procedure and verbal consent obtained Procedure: Patient was prepped for the procedure. Utilized an otoscope to assess and take note of the ear canal, the tympanic membrane, and the presence, amount, and placement of the cerumen. Gentle water irrigation and soft plastic curette was utilized to remove cerumen.  Post procedure examination: shows cerumen was completely removed. Patient tolerated procedure well. The patient is made aware that they may experience temporary vertigo, temporary hearing loss, and temporary discomfort. If these symptom last for more than 24 hours to call the clinic or proceed to the ED.   ASSESSMENT/PLAN:   Impacted cerumen of left ear - doing well after cerumen removal by instrumentation   Need for immunization against influenza - Plan: Flu Vaccine QUAD 36+ mos IM        Visit summary with medication list and pertinent instructions was printed for patient to review. All questions at time of visit were answered - patient instructed to contact office with any  additional concerns. ER/RTC precautions were reviewed with the patient. Follow-up plan: Return for routine care as directed by PCP .  Note: Total time spent 15 minutes, greater than 50% of the visit was spent face-to-face counseling and coordinating care for the following: The primary encounter diagnosis was Impacted cerumen of left ear. A diagnosis of Need for immunization against influenza was also pertinent to this visit.[name]  Please note: voice recognition software was used to produce this document, and typos may escape review. Please contact me for any needed clarifications.

## 2017-06-24 ENCOUNTER — Encounter: Payer: Self-pay | Admitting: Osteopathic Medicine

## 2017-06-24 DIAGNOSIS — Z23 Encounter for immunization: Secondary | ICD-10-CM | POA: Diagnosis not present

## 2017-08-05 DIAGNOSIS — H2513 Age-related nuclear cataract, bilateral: Secondary | ICD-10-CM | POA: Diagnosis not present

## 2017-08-05 DIAGNOSIS — H52222 Regular astigmatism, left eye: Secondary | ICD-10-CM | POA: Diagnosis not present

## 2017-12-27 ENCOUNTER — Other Ambulatory Visit: Payer: Self-pay | Admitting: Physician Assistant

## 2017-12-27 DIAGNOSIS — I10 Essential (primary) hypertension: Secondary | ICD-10-CM

## 2018-01-10 ENCOUNTER — Other Ambulatory Visit: Payer: Self-pay | Admitting: Physician Assistant

## 2018-01-10 DIAGNOSIS — E7800 Pure hypercholesterolemia, unspecified: Secondary | ICD-10-CM

## 2018-01-28 ENCOUNTER — Encounter: Payer: Self-pay | Admitting: Physician Assistant

## 2018-01-28 ENCOUNTER — Ambulatory Visit (INDEPENDENT_AMBULATORY_CARE_PROVIDER_SITE_OTHER): Payer: Medicare Other | Admitting: Physician Assistant

## 2018-01-28 VITALS — BP 120/90 | HR 81 | Ht 65.0 in | Wt 174.0 lb

## 2018-01-28 DIAGNOSIS — J432 Centrilobular emphysema: Secondary | ICD-10-CM | POA: Diagnosis not present

## 2018-01-28 DIAGNOSIS — H6123 Impacted cerumen, bilateral: Secondary | ICD-10-CM | POA: Diagnosis not present

## 2018-01-28 DIAGNOSIS — E781 Pure hyperglyceridemia: Secondary | ICD-10-CM

## 2018-01-28 DIAGNOSIS — Z0000 Encounter for general adult medical examination without abnormal findings: Secondary | ICD-10-CM

## 2018-01-28 DIAGNOSIS — R911 Solitary pulmonary nodule: Secondary | ICD-10-CM | POA: Diagnosis not present

## 2018-01-28 DIAGNOSIS — Z23 Encounter for immunization: Secondary | ICD-10-CM

## 2018-01-28 DIAGNOSIS — Z122 Encounter for screening for malignant neoplasm of respiratory organs: Secondary | ICD-10-CM | POA: Diagnosis not present

## 2018-01-28 DIAGNOSIS — E7800 Pure hypercholesterolemia, unspecified: Secondary | ICD-10-CM

## 2018-01-28 DIAGNOSIS — E782 Mixed hyperlipidemia: Secondary | ICD-10-CM | POA: Diagnosis not present

## 2018-01-28 DIAGNOSIS — Z131 Encounter for screening for diabetes mellitus: Secondary | ICD-10-CM

## 2018-01-28 NOTE — Progress Notes (Signed)
Subjective:    Patient ID: Olivia Frank, female    DOB: [birth year], 67 y.o.   MRN: [id number]  HPI Patient is a 67 yo female with a pmhx of COPD, melanoma, and cerumen impactions reporting to the clinic today for fullness in her ears. She says they have felt stopped up for 1 month. She has dobrex at home that she uses occassionally but she has not used it for about a month. She has some hearing loss. She does not use Q tips and denies headache, dizziness, fever, chills.  She has some post nasal drip and congestion at night for which she uses allergy medications and pediatric saline.  She is a current smoker. Lung cancer CT screening done 12/2016.  .. Active Ambulatory Problems    Diagnosis Date Noted  . Melanoma of skin ([place]) 04/20/2009  . HYPERLIPIDEMIA 05/18/2009  . EUSTACHIAN TUBE DYSFUNCTION, RIGHT 04/20/2009  . SINUSITIS 09/22/2009  . MAMMOGRAM, ABNORMAL 07/18/2009  . Postmenopausal 05/18/2009  . PNEUMONIA, ORGANISM UNSPECIFIED 09/20/2010  . CHRONIC OBSTRUCTIVE PULMONARY DISEASE, ACUTE 10/04/2010  . FELON 05/27/2011  . Low back pain 04/14/2012  . Fracture of radial head, right, closed 10/08/2013  . Left wrist sprain 10/08/2013  . Primary osteoarthritis of both knees 11/29/2014  . Elevated blood pressure 05/17/2015  . Stress at home 05/17/2015  . Cerumen impaction 05/17/2015  . Hyperlipidemia 06/06/2015  . Hypertriglyceridemia 06/06/2015  . Cervical spondylosis with radiculopathy 11/06/2015  . Basal cell carcinoma of left lower leg 03/22/2016  . Hyperplastic colon polyp 12/23/2016  . Essential hypertension 12/23/2016  . Pulmonary nodule, right 12/26/2016  . Emphysema lung ([place]) 12/26/2016  . Coronary atherosclerosis 12/26/2016  . Aortic atherosclerosis ([place]) 12/26/2016  . Bilateral impacted cerumen 01/28/2018   Resolved Ambulatory Problems    Diagnosis Date Noted  . No Resolved Ambulatory Problems   Past Medical History:  Diagnosis Date  . COPD (chronic  obstructive pulmonary disease) ([place])   . Melanoma ([place])      Review of Systems  All other systems reviewed and are negative.      Objective:   Physical Exam  Constitutional: She is oriented to person, place, and time. She appears well-developed and well-nourished. No distress.  HENT:  Head: Normocephalic and atraumatic.  Right Ear: External ear normal. No tenderness.  Left Ear: External ear normal. No tenderness.  Ears:  Pulmonary/Chest: Effort normal and breath sounds normal.  Neurological: She is alert and oriented to person, place, and time.  Skin: Skin is warm and dry. She is not diaphoretic.  Psychiatric: She has a normal mood and affect. Her behavior is normal.    .. Active Ambulatory Problems    Diagnosis Date Noted  . Melanoma of skin ([place]) 04/20/2009  . HYPERLIPIDEMIA 05/18/2009  . EUSTACHIAN TUBE DYSFUNCTION, RIGHT 04/20/2009  . SINUSITIS 09/22/2009  . MAMMOGRAM, ABNORMAL 07/18/2009  . Postmenopausal 05/18/2009  . PNEUMONIA, ORGANISM UNSPECIFIED 09/20/2010  . CHRONIC OBSTRUCTIVE PULMONARY DISEASE, ACUTE 10/04/2010  . FELON 05/27/2011  . Low back pain 04/14/2012  . Fracture of radial head, right, closed 10/08/2013  . Left wrist sprain 10/08/2013  . Primary osteoarthritis of both knees 11/29/2014  . Elevated blood pressure 05/17/2015  . Stress at home 05/17/2015  . Cerumen impaction 05/17/2015  . Hyperlipidemia 06/06/2015  . Hypertriglyceridemia 06/06/2015  . Cervical spondylosis with radiculopathy 11/06/2015  . Basal cell carcinoma of left lower leg 03/22/2016  . Hyperplastic colon polyp 12/23/2016  . Essential hypertension 12/23/2016  . Pulmonary nodule, right 12/26/2016  .  Emphysema lung ([place]) 12/26/2016  . Coronary atherosclerosis 12/26/2016  . Aortic atherosclerosis ([place]) 12/26/2016  . Bilateral impacted cerumen 01/28/2018   Resolved Ambulatory Problems    Diagnosis Date Noted  . No Resolved Ambulatory Problems   Past Medical History:    Diagnosis Date  . COPD (chronic obstructive pulmonary disease) ([place])   . Melanoma ([place])   .[name] Family History  Problem Relation Age of Onset  . Cancer Mother        breast  . Hyperlipidemia Mother   . Heart disease Father   . Cancer Sister   . Seizures Sister   . Cancer Maternal Grandmother        breast  . Cancer Maternal Grandfather        colon        Assessment & Plan:  Diagnoses and all orders for this visit:  Bilateral impacted cerumen  Centrilobular emphysema ([place]) -     Ambulatory Referral for Lung Cancer Scre  Mixed hyperlipidemia -     Lipid Panel w/reflex Direct LDL  Hypertriglyceridemia -     Lipid Panel w/reflex Direct LDL  Pulmonary nodule, right -     Ambulatory Referral for Lung Cancer Scre  Screening for diabetes mellitus -     COMPLETE METABOLIC PANEL WITH GFR  Encounter for screening for lung cancer -     Ambulatory Referral for Lung Cancer Scre  Preventative health care -     Lipid Panel w/reflex Direct LDL -     COMPLETE METABOLIC PANEL WITH GFR -     Ambulatory Referral for Lung Cancer Scre -     CBC  Need for vaccination against Streptococcus pneumoniae -     Pneumococcal polysaccharide vaccine 23-valent greater than or equal to 2yo subcutaneous/IM  .Marland KitchenIndication: Cerumen impaction of the ear(s)  Medical necessity statement: On physical examination, cerumen impairs clinically significant portions of the external auditory canal, and tympanic membrane. Noted obstructive, copious cerumen that cannot be removed without magnification and instrumentations requiring physician skills Consent: Discussed benefits and risks of procedure and verbal consent obtained Procedure: Patient was prepped for the procedure. Utilized an otoscope to assess and take note of the ear canal, the tympanic membrane, and the presence, amount, and placement of the cerumen. Gentle water irrigation and soft plastic curette was utilized to remove cerumen.  Post procedure  examination: shows cerumen was completely removed. Patient tolerated procedure well. The patient is made aware that they may experience temporary vertigo, temporary hearing loss, and temporary discomfort. If these symptom last for more than 24 hours to call the clinic or proceed to the ED.  Referral made for annual screening for lung cancer.   Fasting labs ordered.

## 2018-01-29 ENCOUNTER — Encounter: Payer: Self-pay | Admitting: Physician Assistant

## 2018-02-05 ENCOUNTER — Ambulatory Visit: Payer: Medicare Other

## 2018-02-05 DIAGNOSIS — F17210 Nicotine dependence, cigarettes, uncomplicated: Principal | ICD-10-CM

## 2018-02-09 ENCOUNTER — Other Ambulatory Visit: Payer: Self-pay | Admitting: Acute Care

## 2018-02-09 DIAGNOSIS — Z122 Encounter for screening for malignant neoplasm of respiratory organs: Secondary | ICD-10-CM

## 2018-02-09 DIAGNOSIS — F17210 Nicotine dependence, cigarettes, uncomplicated: Principal | ICD-10-CM

## 2018-02-17 ENCOUNTER — Other Ambulatory Visit: Payer: Self-pay | Admitting: Physician Assistant

## 2018-02-17 DIAGNOSIS — Z1231 Encounter for screening mammogram for malignant neoplasm of breast: Secondary | ICD-10-CM

## 2018-02-18 DIAGNOSIS — E782 Mixed hyperlipidemia: Secondary | ICD-10-CM | POA: Diagnosis not present

## 2018-02-18 DIAGNOSIS — E781 Pure hyperglyceridemia: Secondary | ICD-10-CM | POA: Diagnosis not present

## 2018-02-18 DIAGNOSIS — Z0000 Encounter for general adult medical examination without abnormal findings: Secondary | ICD-10-CM | POA: Diagnosis not present

## 2018-02-18 DIAGNOSIS — Z131 Encounter for screening for diabetes mellitus: Secondary | ICD-10-CM | POA: Diagnosis not present

## 2018-02-18 LAB — CBC
HEMATOCRIT: 46.6 % — AB (ref 35.0–45.0)
Hemoglobin: 15.9 g/dL — ABNORMAL HIGH (ref 11.7–15.5)
MCH: 31.7 pg (ref 27.0–33.0)
MCHC: 34.1 g/dL (ref 32.0–36.0)
MCV: 92.8 fL (ref 80.0–100.0)
MPV: 9.8 fL (ref 7.5–12.5)
Platelets: 287 10*3/uL (ref 140–400)
RBC: 5.02 10*6/uL (ref 3.80–5.10)
RDW: 11.9 % (ref 11.0–15.0)
WBC: 9.4 10*3/uL (ref 3.8–10.8)

## 2018-02-18 LAB — COMPLETE METABOLIC PANEL WITH GFR
AG RATIO: 1.7 (calc) (ref 1.0–2.5)
ALT: 27 U/L (ref 6–29)
AST: 22 U/L (ref 10–35)
Albumin: 4.1 g/dL (ref 3.6–5.1)
Alkaline phosphatase (APISO): 86 U/L (ref 33–130)
BILIRUBIN TOTAL: 0.6 mg/dL (ref 0.2–1.2)
BUN: 13 mg/dL (ref 7–25)
CHLORIDE: 104 mmol/L (ref 98–110)
CO2: 28 mmol/L (ref 20–32)
Calcium: 9.5 mg/dL (ref 8.6–10.4)
Creat: 0.66 mg/dL (ref 0.50–0.99)
GFR, Est African American: 106 mL/min/{1.73_m2} (ref >=60)
GFR, Est Non African American: 91 mL/min/{1.73_m2} (ref >=60)
Globulin: 2.4 g/dL (calc) (ref 1.9–3.7)
Glucose, Bld: 89 mg/dL (ref 65–99)
POTASSIUM: 4.2 mmol/L (ref 3.5–5.3)
Sodium: 141 mmol/L (ref 135–146)
TOTAL PROTEIN: 6.5 g/dL (ref 6.1–8.1)

## 2018-02-18 LAB — LIPID PANEL W/REFLEX DIRECT LDL
CHOL/HDL RATIO: 2.9 (calc) (ref <5.0)
CHOLESTEROL: 181 mg/dL (ref <200)
HDL: 62 mg/dL (ref >50)
LDL Cholesterol (Calc): 86 mg/dL (calc)
NON-HDL CHOLESTEROL (CALC): 119 mg/dL (ref <130)
TRIGLYCERIDES: 241 mg/dL — AB (ref <150)

## 2018-02-20 MED ORDER — ATORVASTATIN CALCIUM 40 MG PO TABS: 40.0000 mg | ORAL_TABLET | Freq: Every day | ORAL | 4 refills | Status: DC

## 2018-02-20 NOTE — Addendum Note (Signed)
Addended by: Donella Stade on: 02/20/2018 01:48 PM   Modules accepted: Orders

## 2018-02-20 NOTE — Progress Notes (Signed)
Call pt: LDL great. Will refill lipitor. TG continue to be elevated. I do not see fish oil on med list. I would start 4000mg  daily for TG. Consider decrease carbs and sugars as well.

## 2018-02-26 ENCOUNTER — Ambulatory Visit (INDEPENDENT_AMBULATORY_CARE_PROVIDER_SITE_OTHER): Payer: Medicare Other

## 2018-02-26 DIAGNOSIS — Z1231 Encounter for screening mammogram for malignant neoplasm of breast: Secondary | ICD-10-CM

## 2018-02-26 NOTE — Progress Notes (Signed)
Call pt: normal mammogram. Follow up in 1 year.

## 2018-04-21 DIAGNOSIS — Z85828 Personal history of other malignant neoplasm of skin: Secondary | ICD-10-CM | POA: Diagnosis not present

## 2018-04-21 DIAGNOSIS — Z85820 Personal history of malignant melanoma of skin: Secondary | ICD-10-CM | POA: Diagnosis not present

## 2018-04-21 DIAGNOSIS — B079 Viral wart, unspecified: Secondary | ICD-10-CM | POA: Diagnosis not present

## 2018-04-21 DIAGNOSIS — D1801 Hemangioma of skin and subcutaneous tissue: Secondary | ICD-10-CM | POA: Diagnosis not present

## 2018-04-21 DIAGNOSIS — L570 Actinic keratosis: Secondary | ICD-10-CM | POA: Diagnosis not present

## 2018-04-21 DIAGNOSIS — D485 Neoplasm of uncertain behavior of skin: Secondary | ICD-10-CM | POA: Diagnosis not present

## 2018-05-26 DIAGNOSIS — Z23 Encounter for immunization: Secondary | ICD-10-CM | POA: Diagnosis not present

## 2018-07-22 NOTE — Progress Notes (Signed)
Subjective:   Olivia Frank is a 67 y.o. female who presents for Medicare Annual (Subsequent) preventive examination.  Review of Systems:  No ROS.  Medicare Wellness Visit. Additional risk factors are reflected in the social history.  Cardiac Risk Factors include: hypertension;dyslipidemia;smoking/ tobacco exposure;advanced age (>87men, >7 women) Sleep patterns: Getting 8 hours of sleep a night, wakes up occasionally at night to go to bathroom. Feels rested upon waking up.  Home Safety/Smoke Alarms: Feels safe in home. Smoke alarms in place.  Living environment; Lives with husband and son in a 1 story home steps to the basement handrails present. Shower is a walk in shower with grab bars in place and a seat. Seat Belt Safety/Bike Helmet: Wears seat belt.   Female:   Pap- aged out unless necessary      Mammo-  utd     Dexa scan-  utd      CCS- utd     Objective:     Vitals: There were no vitals taken for this visit.  There is no height or weight on file to calculate BMI.  Advanced Directives 07/29/2018 11/06/2015 05/17/2015  Does Patient Have a Medical Advance Directive? Yes No No  Type of Paramedic of Olivia Frank;Living will - -  Does patient want to make changes to medical advance directive? No - Patient declined - -  Copy of [place] in Chart? No - copy requested - -  Would patient like information on creating a medical advance directive? - No - patient declined information Yes - Educational materials given    Tobacco Social History   Tobacco Use  Smoking Status Current Every Day Smoker  . Packs/day: 1.00  . Years: 51.00  . Pack years: 51.00  . Types: Cigarettes  Smokeless Tobacco Never Used     Ready to quit: Not Answered Counseling given: Not Answered   Clinical Intake:  Pre-visit preparation completed: Yes  Pain : No/denies pain     Nutritional Risks: None Diabetes: No  How often do you need to have someone  help you when you read instructions, pamphlets, or other written materials from your doctor or pharmacy?: 1 - Never What is the last grade level you completed in school?: 12  Interpreter Needed?: No  Information entered by :: Olivia Dakin, LPN  Past Medical History:  Diagnosis Date  . COPD (chronic obstructive pulmonary disease) ([place])   . Melanoma [facility])    Past Surgical History:  Procedure Laterality Date  . ABDOMINAL HYSTERECTOMY    . APPENDECTOMY    . OVARIAN CYST REMOVAL     Family History  Problem Relation Age of Onset  . Cancer Mother        breast  . Hyperlipidemia Mother   . Heart disease Father   . Cancer Sister   . Seizures Sister   . Cancer Maternal Grandmother        breast  . Cancer Maternal Grandfather        colon   Social History   Socioeconomic History  . Marital status: Married    Spouse name: Olivia Frank  . Number of children: 2  . Years of education: 4  . Highest education level: 12th grade  Occupational History  . Occupation: retired    Comment: Air cabin crew for [employer]  . Financial resource strain: Not hard at all  . Food insecurity:    Worry: Never true    Inability: Never true  .  Transportation needs:    Medical: No    Non-medical: No  Tobacco Use  . Smoking status: Current Every Day Smoker    Packs/day: 1.00    Years: 51.00    Pack years: 51.00    Types: Cigarettes  . Smokeless tobacco: Never Used  Substance and Sexual Activity  . Alcohol use: Yes    Alcohol/week: 1.0 standard drinks    Types: 1 Glasses of wine per week    Comment: a night with different  . Drug use: No  . Sexual activity: Not Currently  Lifestyle  . Physical activity:    Days per week: 3 days    Minutes per session: 120 min  . Stress: Only a little  Relationships  . Social connections:    Talks on phone: More than three times a week    Gets together: Never    Attends religious service: Never    Active member of club or organization:  No    Attends meetings of clubs or organizations: Never    Relationship status: Married  Other Topics Concern  . Not on file  Social History Narrative   Goes to the [facility] for exercise 3 times a week. Takes care of her husband who has vascular dementia and son who is on disability so has a lot on her plate on a daily basis but manageing    Outpatient Encounter Medications as of 07/29/2018  Medication Sig  . atorvastatin (LIPITOR) 40 MG tablet Take 1 tablet (40 mg total) by mouth daily.  . Omega-3 Fatty Acids (FISH OIL) 1000 MG CAPS Take by mouth.  . vitamin B-12 (CYANOCOBALAMIN) 1000 MCG tablet Take 1,000 mcg by mouth daily.  [name] VITAMIN D, CHOLECALCIFEROL, PO Take 5,000 Units 3 (three) times daily by mouth.   . [DISCONTINUED] fluconazole (DIFLUCAN) 150 MG tablet Take 1 tablet (150 mg total) by mouth every other day.   No facility-administered encounter medications on file as of 07/29/2018.     Activities of Daily Living In your present state of health, do you have any difficulty performing the following activities: 07/29/2018  Hearing? N  Vision? N  Difficulty concentrating or making decisions? N  Walking or climbing stairs? N  Dressing or bathing? N  Doing errands, shopping? N  Preparing Food and eating ? N  Using the Toilet? N  In the past six months, have you accidently leaked urine? N  Do you have problems with loss of bowel control? N  Managing your Medications? N  Managing your Finances? N  Housekeeping or managing your Housekeeping? N  Some recent data might be hidden    Patient Care Team: Olivia Frank as PCP - General (Family Medicine)    Assessment:   This is a routine wellness examination for Olivia Frank.Physical assessment deferred to PCP.   Exercise Activities and Dietary recommendations Current Exercise Habits: Structured exercise class, Type of exercise: stretching;strength training/weights;exercise ball;Other - see comments(stationary bike, weight  presses. Belongs to Silver sneakers), Time (Minutes): > 60, Frequency (Times/Week): 3, Weekly Exercise (Minutes/Week): 0, Intensity: Moderate Diet  Eats at home mostly, but tries to eat vegetables and fruits. Breakfast: raison oatmeal or scrambled egg on piece of toast Lunch: sandwich Dinner: meat vegetable potato with salad Drinking 64 ounces of water daily.      Goals    . Weight (lb) < 200 lb (90.7 kg)     Patient would like to loose weight in the next year at least 10lbs  Fall Risk Fall Risk  07/29/2018  Falls in the past year? 0  Follow up Falls prevention discussed   Is the patient's home free of loose throw rugs in walkways, pet beds, electrical cords, etc?   yes      Grab bars in the bathroom? yes      Handrails on the stairs?   yes      Adequate lighting?   yes  Depression Screen PHQ 2/9 Scores 07/29/2018 01/28/2018 11/25/2016  PHQ - 2 Score 0 1 1     Cognitive Function     6CIT Screen 07/29/2018 11/25/2016  What Year? 0 points 0 points  What month? 0 points 0 points  What time? 0 points 0 points  Count back from 20 0 points 0 points  Months in reverse 0 points 0 points  Repeat phrase 0 points 0 points  Total Score 0 0    Immunization History  Administered Date(s) Administered  . Influenza, High Dose Seasonal PF 05/26/2018  . Influenza,inj,Quad PF,6+ Mos 06/23/2017, 06/24/2017  . Pneumococcal Conjugate-13 11/25/2016  . Pneumococcal Polysaccharide-23 01/28/2018  . Td 05/18/2009  . Zoster 10/15/2011    Screening Tests Health Maintenance  Topic Date Due  . TETANUS/TDAP  05/19/2019  . MAMMOGRAM  02/27/2020  . COLONOSCOPY  12/20/2026  . INFLUENZA VACCINE  Completed  . DEXA SCAN  Completed  . Hepatitis C Screening  Completed  . PNA vac Low Risk Adult  Completed        Plan:      Ms. Callins , Thank you for taking time to come for your Medicare Wellness Visit. I appreciate your ongoing commitment to your health goals. Please review the  following plan we discussed and let me know if I can assist you in the future.  Please schedule your next medicare wellness visit with me in 1 yr. Continue doing brain stimulating activities (puzzles, reading, adult coloring books, staying active) to keep memory sharp.  Bring a copy of your living will and/or healthcare power of attorney to your next office visit.   These are the goals we discussed: Goals    . Weight (lb) < 200 lb (90.7 kg)     Patient would like to loose weight in the next year at least 10lbs       This is a list of the screening recommended for you and due dates:  Health Maintenance  Topic Date Due  . Tetanus Vaccine  05/19/2019  . Mammogram  02/27/2020  . Colon Cancer Screening  12/20/2026  . Flu Shot  Completed  . DEXA scan (bone density measurement)  Completed  .  Hepatitis C: One time screening is recommended by Center for Disease Control  (CDC) for  adults born from [birth year] through 1965.   Completed  . Pneumonia vaccines  Completed      I have personally reviewed and noted the following in the patient's chart:   . Medical and social history . Use of alcohol, tobacco or illicit drugs  . Current medications and supplements . Functional ability and status . Nutritional status . Physical activity . Advanced directives . List of other physicians . Hospitalizations, surgeries, and ER visits in previous 12 months . Vitals . Screenings to include cognitive, depression, and falls . Referrals and appointments  In addition, I have reviewed and discussed with patient certain preventive protocols, quality metrics, and best practice recommendations. A written personalized care plan for preventive services as well as general preventive health recommendations were  provided to patient.     Joanne Chars, LPN  38/18/[id number]

## 2018-07-29 ENCOUNTER — Ambulatory Visit (INDEPENDENT_AMBULATORY_CARE_PROVIDER_SITE_OTHER): Payer: Medicare Other | Admitting: *Deleted

## 2018-07-29 VITALS — BP 139/80 | HR 79 | Ht 65.0 in | Wt 181.0 lb

## 2018-07-29 DIAGNOSIS — Z0000 Encounter for general adult medical examination without abnormal findings: Secondary | ICD-10-CM | POA: Diagnosis not present

## 2018-07-29 NOTE — Patient Instructions (Addendum)
Ms. Besecker , Thank you for taking time to come for your Medicare Wellness Visit. I appreciate your ongoing commitment to your health goals. Please review the following plan we discussed and let me know if I can assist you in the future.  Please schedule your next medicare wellness visit with me in 1 yr. Continue doing brain stimulating activities (puzzles, reading, adult coloring books, staying active) to keep memory sharp.  Bring a copy of your living will and/or healthcare power of attorney to your next office visit.  These are the goals we discussed: Goals    . Weight (lb) < 200 lb (90.7 kg)     Patient would like to loose weight in the next year at least 10lbs     Calorie Counting for Weight Loss Calories are units of energy. Your body needs a certain amount of calories from food to keep you going throughout the day. When you eat more calories than your body needs, your body stores the extra calories as fat. When you eat fewer calories than your body needs, your body burns fat to get the energy it needs. Calorie counting means keeping track of how many calories you eat and drink each day. Calorie counting can be helpful if you need to lose weight. If you make sure to eat fewer calories than your body needs, you should lose weight. Ask your health care provider what a healthy weight is for you. For calorie counting to work, you will need to eat the right number of calories in a day in order to lose a healthy amount of weight per week. A dietitian can help you determine how many calories you need in a day and will give you suggestions on how to reach your calorie goal.  A healthy amount of weight to lose per week is usually 1-2 lb (0.5-0.9 kg). This usually means that your daily calorie intake should be reduced by 500-750 calories.  Eating 1,200 - 1,500 calories per day can help most women lose weight.  Eating 1,500 - 1,800 calories per day can help most men lose weight.  What is my  plan? My goal is to have __________ calories per day. If I have this many calories per day, I should lose around __________ pounds per week. What do I need to know about calorie counting? In order to meet your daily calorie goal, you will need to:  Find out how many calories are in each food you would like to eat. Try to do this before you eat.  Decide how much of the food you plan to eat.  Write down what you ate and how many calories it had. Doing this is called keeping a food log.  To successfully lose weight, it is important to balance calorie counting with a healthy lifestyle that includes regular activity. Aim for 150 minutes of moderate exercise (such as walking) or 75 minutes of vigorous exercise (such as running) each week. Where do I find calorie information?  The number of calories in a food can be found on a Nutrition Facts label. If a food does not have a Nutrition Facts label, try to look up the calories online or ask your dietitian for help. Remember that calories are listed per serving. If you choose to have more than one serving of a food, you will have to multiply the calories per serving by the amount of servings you plan to eat. For example, the label on a package of bread might say that  a serving size is 1 slice and that there are 90 calories in a serving. If you eat 1 slice, you will have eaten 90 calories. If you eat 2 slices, you will have eaten 180 calories. How do I keep a food log? Immediately after each meal, record the following information in your food log:  What you ate. Don't forget to include toppings, sauces, and other extras on the food.  How much you ate. This can be measured in cups, ounces, or number of items.  How many calories each food and drink had.  The total number of calories in the meal.  Keep your food log near you, such as in a small notebook in your pocket, or use a mobile app or website. Some programs will calculate calories for you and  show you how many calories you have left for the day to meet your goal. What are some calorie counting tips?  Use your calories on foods and drinks that will fill you up and not leave you hungry: ? Some examples of foods that fill you up are nuts and nut butters, vegetables, lean proteins, and high-fiber foods like whole grains. High-fiber foods are foods with more than 5 g fiber per serving. ? Drinks such as sodas, specialty coffee drinks, alcohol, and juices have a lot of calories, yet do not fill you up.  Eat nutritious foods and avoid empty calories. Empty calories are calories you get from foods or beverages that do not have many vitamins or protein, such as candy, sweets, and soda. It is better to have a nutritious high-calorie food (such as an avocado) than a food with few nutrients (such as a bag of chips).  Know how many calories are in the foods you eat most often. This will help you calculate calorie counts faster.  Pay attention to calories in drinks. Low-calorie drinks include water and unsweetened drinks.  Pay attention to nutrition labels for "low fat" or "fat free" foods. These foods sometimes have the same amount of calories or more calories than the full fat versions. They also often have added sugar, starch, or salt, to make up for flavor that was removed with the fat.  Find a way of tracking calories that works for you. Get creative. Try different apps or programs if writing down calories does not work for you. What are some portion control tips?  Know how many calories are in a serving. This will help you know how many servings of a certain food you can have.  Use a measuring cup to measure serving sizes. You could also try weighing out portions on a kitchen scale. With time, you will be able to estimate serving sizes for some foods.  Take some time to put servings of different foods on your favorite plates, bowls, and cups so you know what a serving looks like.  Try not  to eat straight from a bag or box. Doing this can lead to overeating. Put the amount you would like to eat in a cup or on a plate to make sure you are eating the right portion.  Use smaller plates, glasses, and bowls to prevent overeating.  Try not to multitask (for example, watch TV or use your computer) while eating. If it is time to eat, sit down at a table and enjoy your food. This will help you to know when you are full. It will also help you to be aware of what you are eating and how much you  are eating. What are tips for following this plan? Reading food labels  Check the calorie count compared to the serving size. The serving size may be smaller than what you are used to eating.  Check the source of the calories. Make sure the food you are eating is high in vitamins and protein and low in saturated and trans fats. Shopping  Read nutrition labels while you shop. This will help you make healthy decisions before you decide to purchase your food.  Make a grocery list and stick to it. Cooking  Try to cook your favorite foods in a healthier way. For example, try baking instead of frying.  Use low-fat dairy products. Meal planning  Use more fruits and vegetables. Half of your plate should be fruits and vegetables.  Include lean proteins like poultry and fish. How do I count calories when eating out?  Ask for smaller portion sizes.  Consider sharing an entree and sides instead of getting your own entree.  If you get your own entree, eat only half. Ask for a box at the beginning of your meal and put the rest of your entree in it so you are not tempted to eat it.  If calories are listed on the menu, choose the lower calorie options.  Choose dishes that include vegetables, fruits, whole grains, low-fat dairy products, and lean protein.  Choose items that are boiled, broiled, grilled, or steamed. Stay away from items that are buttered, battered, fried, or served with cream sauce.  Items labeled "crispy" are usually fried, unless stated otherwise.  Choose water, low-fat milk, unsweetened iced tea, or other drinks without added sugar. If you want an alcoholic beverage, choose a lower calorie option such as a glass of wine or light beer.  Ask for dressings, sauces, and syrups on the side. These are usually high in calories, so you should limit the amount you eat.  If you want a salad, choose a garden salad and ask for grilled meats. Avoid extra toppings like bacon, cheese, or fried items. Ask for the dressing on the side, or ask for olive oil and vinegar or lemon to use as dressing.  Estimate how many servings of a food you are given. For example, a serving of cooked rice is  cup or about the size of half a baseball. Knowing serving sizes will help you be aware of how much food you are eating at restaurants. The list below tells you how big or small some common portion sizes are based on everyday objects: ? 1 oz-4 stacked dice. ? 3 oz-1 deck of cards. ? 1 tsp-1 die. ? 1 Tbsp- a ping-pong ball. ? 2 Tbsp-1 ping-pong ball. ?  cup- baseball. ? 1 cup-1 baseball. Summary  Calorie counting means keeping track of how many calories you eat and drink each day. If you eat fewer calories than your body needs, you should lose weight.  A healthy amount of weight to lose per week is usually 1-2 lb (0.5-0.9 kg). This usually means reducing your daily calorie intake by 500-750 calories.  The number of calories in a food can be found on a Nutrition Facts label. If a food does not have a Nutrition Facts label, try to look up the calories online or ask your dietitian for help.  Use your calories on foods and drinks that will fill you up, and not on foods and drinks that will leave you hungry.  Use smaller plates, glasses, and bowls to prevent overeating. This information  is not intended to replace advice given to you by your health care provider. Make sure you discuss any questions  you have with your health care provider. Document Released: 08/05/2005 Document Revised: 07/05/2016 Document Reviewed: 07/05/2016 Elsevier Interactive Patient Education  [employer].

## 2018-08-31 ENCOUNTER — Encounter: Payer: Self-pay | Admitting: Sports Medicine

## 2018-08-31 ENCOUNTER — Ambulatory Visit (INDEPENDENT_AMBULATORY_CARE_PROVIDER_SITE_OTHER): Payer: Medicare Other | Admitting: Sports Medicine

## 2018-08-31 DIAGNOSIS — M4722 Other spondylosis with radiculopathy, cervical region: Secondary | ICD-10-CM | POA: Diagnosis not present

## 2018-08-31 NOTE — Progress Notes (Signed)
Subjective:    CC: Neck pain  HPI: Olivia Frank is a very pleasant 68 year old female with multifactorial cervical spondylosis.  3 years ago she was having both axial and radicular cervical pain.  Her radicular symptoms went away with a cervical epidural and her axial pain resolved with a left C4-5 and C3-4 cervical facet joint injection.  She has done well until recently.  Now having recurrence of pain, all axial without radiculitis.  Left side of the neck, worse with extension and left rotation.  She does desire to proceed straight to interventional treatment rather than bothering with medications.  I reviewed the past medical history, family history, social history, surgical history, and allergies today and no changes were needed.  Please see the problem list section below in epic for further details.  Past Medical History: Past Medical History:  Diagnosis Date  . COPD (chronic obstructive pulmonary disease) ([place])   . Melanoma [facility])    Past Surgical History: Past Surgical History:  Procedure Laterality Date  . ABDOMINAL HYSTERECTOMY    . APPENDECTOMY    . OVARIAN CYST REMOVAL     Social History: Social History   Socioeconomic History  . Marital status: Married    Spouse name: Elberta Fortis  . Number of children: 2  . Years of education: 58  . Highest education level: 12th grade  Occupational History  . Occupation: retired    Comment: Air cabin crew for [employer]  . Financial resource strain: Not hard at all  . Food insecurity:    Worry: Never true    Inability: Never true  . Transportation needs:    Medical: No    Non-medical: No  Tobacco Use  . Smoking status: Current Every Day Smoker    Packs/day: 1.00    Years: 51.00    Pack years: 51.00    Types: Cigarettes  . Smokeless tobacco: Never Used  Substance and Sexual Activity  . Alcohol use: Yes    Alcohol/week: 1.0 standard drinks    Types: 1 Glasses of wine per week    Comment: a night with different    . Drug use: No  . Sexual activity: Not Currently  Lifestyle  . Physical activity:    Days per week: 3 days    Minutes per session: 120 min  . Stress: Only a little  Relationships  . Social connections:    Talks on phone: More than three times a week    Gets together: Never    Attends religious service: Never    Active member of club or organization: No    Attends meetings of clubs or organizations: Never    Relationship status: Married  Other Topics Concern  . Not on file  Social History Narrative   Goes to the [facility] for exercise 3 times a week. Takes care of her husband who has vascular dementia and son who is on disability so has a lot on her plate on a daily basis but manageing   Family History: Family History  Problem Relation Age of Onset  . Cancer Mother        breast  . Hyperlipidemia Mother   . Heart disease Father   . Cancer Sister   . Seizures Sister   . Cancer Maternal Grandmother        breast  . Cancer Maternal Grandfather        colon   Allergies: Allergies  Allergen Reactions  . Influenza Vaccines Nausea And Vomiting  Nausea and vomiting after shots. Per pt does not want to take flu shot.   . Morphine And Related Hives and Itching  . Ace Inhibitors     cough   Medications: See med rec.  Review of Systems: No fevers, chills, night sweats, weight loss, chest pain, or shortness of breath.   Objective:    General: Well Developed, well nourished, and in no acute distress.  Neuro: Alert and oriented x3, extra-ocular muscles intact, sensation grossly intact.  HEENT: Normocephalic, atraumatic, pupils equal round reactive to light, neck supple, no masses, no lymphadenopathy, thyroid nonpalpable.  Skin: Warm and dry, no rashes. Cardiac: Regular rate and rhythm, no murmurs rubs or gallops, no lower extremity edema.  Respiratory: Clear to auscultation bilaterally. Not using accessory muscles, speaking in full sentences. Neck: Negative spurling's Full  neck range of motion Grip strength and sensation normal in bilateral hands Strength good C4 to T1 distribution No sensory change to C4 to T1 Reflexes normal  Impression and Recommendations:    Cervical spondylosis with radiculopathy Back in 2017 we had a good response to the left-sided radicular pain with a left-sided cervical epidural. She still had some persistent axial pain so we proceeded with a left C3-C4 and C4-C5 facet joint injection that resulted in 100% relief of her axial pain. Today she has a recurrence of her axial pain, left-sided, worse with spine extension and leftward rotation. I did give the option of prednisone and tramadol but she would like to proceed with an injection, ordering a left C3-C4 and a left C4-C5 facet joint injection. Call me a month after to see how things are going, we can proceed with the epidural if persistent discomfort. ___________________________________________ Gwen Her. Dianah Field, M.D., ABFM., CAQSM. Primary Care and Sports Medicine [facility]  Adjunct Professor of [employer] [facility] [employer]

## 2018-08-31 NOTE — Assessment & Plan Note (Signed)
Back in 2017 we had a good response to the left-sided radicular pain with a left-sided cervical epidural. She still had some persistent axial pain so we proceeded with a left C3-C4 and C4-C5 facet joint injection that resulted in 100% relief of her axial pain. Today she has a recurrence of her axial pain, left-sided, worse with spine extension and leftward rotation. I did give the option of prednisone and tramadol but she would like to proceed with an injection, ordering a left C3-C4 and a left C4-C5 facet joint injection. Call me a month after to see how things are going, we can proceed with the epidural if persistent discomfort.

## 2018-09-14 ENCOUNTER — Other Ambulatory Visit: Payer: Medicare Other

## 2018-10-27 DIAGNOSIS — D485 Neoplasm of uncertain behavior of skin: Secondary | ICD-10-CM | POA: Diagnosis not present

## 2018-10-27 DIAGNOSIS — L570 Actinic keratosis: Secondary | ICD-10-CM | POA: Diagnosis not present

## 2018-11-10 DIAGNOSIS — L570 Actinic keratosis: Secondary | ICD-10-CM | POA: Diagnosis not present

## 2019-02-01 ENCOUNTER — Encounter: Payer: Medicare Other | Admitting: Physician Assistant

## 2019-02-05 ENCOUNTER — Telehealth: Payer: Self-pay | Admitting: Acute Care

## 2019-02-05 ENCOUNTER — Other Ambulatory Visit: Payer: Self-pay | Admitting: Physician Assistant

## 2019-02-05 DIAGNOSIS — Z1231 Encounter for screening mammogram for malignant neoplasm of breast: Secondary | ICD-10-CM

## 2019-02-05 NOTE — Telephone Encounter (Signed)
Rodena Piety if off today she will call her back on Monday Joellen [place]

## 2019-02-05 NOTE — Telephone Encounter (Signed)
Called and spoke to pt to inform her that Rodena Piety will call her Monday. Will forward to [place].

## 2019-02-05 NOTE — Telephone Encounter (Signed)
Pt calling back to scheduled LDCT.

## 2019-02-08 NOTE — Telephone Encounter (Signed)
Olivia Frank has been scheduled for [facility] CT on 02/12/2019 @ 2:00pm and before I could call her back about the appt she stated that MyChart was really fast.

## 2019-02-12 ENCOUNTER — Other Ambulatory Visit: Payer: Self-pay

## 2019-02-12 ENCOUNTER — Ambulatory Visit: Payer: Medicare Other

## 2019-02-12 DIAGNOSIS — Z87891 Personal history of nicotine dependence: Secondary | ICD-10-CM | POA: Diagnosis not present

## 2019-02-12 DIAGNOSIS — F17210 Nicotine dependence, cigarettes, uncomplicated: Secondary | ICD-10-CM

## 2019-02-12 DIAGNOSIS — Z122 Encounter for screening for malignant neoplasm of respiratory organs: Secondary | ICD-10-CM

## 2019-02-16 ENCOUNTER — Other Ambulatory Visit: Payer: Self-pay | Admitting: *Deleted

## 2019-02-16 DIAGNOSIS — Z122 Encounter for screening for malignant neoplasm of respiratory organs: Secondary | ICD-10-CM

## 2019-02-16 DIAGNOSIS — F17210 Nicotine dependence, cigarettes, uncomplicated: Secondary | ICD-10-CM

## 2019-02-16 DIAGNOSIS — Z87891 Personal history of nicotine dependence: Secondary | ICD-10-CM

## 2019-02-17 ENCOUNTER — Telehealth: Payer: Self-pay | Admitting: Neurology

## 2019-02-17 NOTE — Progress Notes (Signed)
Hey just got lung cancer screening results. Overall good results. Repeat in 1 year. Did see some aortic plaque accumulation. Very important to stay on lipitor. Follow up with any problems with SOB/cough

## 2019-02-17 NOTE — Telephone Encounter (Signed)
-----   Message from Donella Stade, [place] sent at 02/17/2019  7:39 AM EDT -----   ----- Message ----- From: Christie Beckers, RN Sent: 02/16/2019  12:57 PM EDT To: Donella Stade, PA-C

## 2019-02-17 NOTE — Telephone Encounter (Signed)
Spoke with patient and given results/recommendations.

## 2019-02-17 NOTE — Telephone Encounter (Signed)
Author: Donella Stade, PA-C Service: - Author Type: Physician Assistant  Filed: 02/17/2019 7:39 AM Encounter Date: 02/16/2019 Status: Signed  Editor: Lavada Mesi (Physician Assistant)      Hey just got lung cancer screening results. Overall good results. Repeat in 1 year. Did see some aortic plaque accumulation. Very important to stay on lipitor. Follow up with any problems with SOB/cough      Left message on machine for patient to call back to make her aware.

## 2019-03-03 ENCOUNTER — Other Ambulatory Visit: Payer: Self-pay

## 2019-03-03 ENCOUNTER — Ambulatory Visit (INDEPENDENT_AMBULATORY_CARE_PROVIDER_SITE_OTHER): Payer: Medicare Other

## 2019-03-03 DIAGNOSIS — Z1231 Encounter for screening mammogram for malignant neoplasm of breast: Secondary | ICD-10-CM

## 2019-03-05 NOTE — Progress Notes (Signed)
Normal mammogram. Follow up in 1 year.

## 2019-03-16 DIAGNOSIS — L810 Postinflammatory hyperpigmentation: Secondary | ICD-10-CM | POA: Diagnosis not present

## 2019-03-20 ENCOUNTER — Encounter: Payer: Self-pay | Admitting: Emergency Medicine

## 2019-03-20 ENCOUNTER — Emergency Department (INDEPENDENT_AMBULATORY_CARE_PROVIDER_SITE_OTHER)
Admission: EM | Admit: 2019-03-20 | Discharge: 2019-03-20 | Disposition: A | Discharge status: Home / Self Care | Payer: Medicare Other | Source: Home / Self Care | Attending: Family Medicine | Admitting: Family Medicine

## 2019-03-20 ENCOUNTER — Other Ambulatory Visit: Payer: Self-pay

## 2019-03-20 DIAGNOSIS — Z23 Encounter for immunization: Secondary | ICD-10-CM | POA: Diagnosis not present

## 2019-03-20 DIAGNOSIS — S61211A Laceration without foreign body of left index finger without damage to nail, initial encounter: Secondary | ICD-10-CM | POA: Diagnosis not present

## 2019-03-20 DIAGNOSIS — T63481A Toxic effect of venom of other arthropod, accidental (unintentional), initial encounter: Secondary | ICD-10-CM

## 2019-03-20 HISTORY — DX: Other seasonal allergic rhinitis: J30.2

## 2019-03-20 MED ORDER — DIPHENHYDRAMINE HCL 25 MG PO CAPS
25.0000 mg | ORAL_CAPSULE | Freq: Once | ORAL | Status: AC
  Administered 2019-03-20: 25 mg via ORAL

## 2019-03-20 MED ORDER — IBUPROFEN 600 MG PO TABS
600.0000 mg | ORAL_TABLET | Freq: Once | ORAL | Status: AC
  Administered 2019-03-20: 600 mg via ORAL

## 2019-03-20 MED ORDER — TETANUS-DIPHTH-ACELL PERTUSSIS 5-2.5-18.5 LF-MCG/0.5 IM SUSP
0.5000 mL | Freq: Once | INTRAMUSCULAR | Status: AC
  Administered 2019-03-20: 0.5 mL via INTRAMUSCULAR

## 2019-03-20 MED ORDER — CEPHALEXIN 500 MG PO CAPS: 500.0000 mg | ORAL_CAPSULE | Freq: Two times a day (BID) | ORAL | 0 refills | Status: DC

## 2019-03-20 NOTE — ED Provider Notes (Signed)
[facility]    CSN: [id number] Arrival date & time: 03/20/19  1151     History   Chief Complaint Chief Complaint  Patient presents with  . Finger Injury    HPI Olivia Frank is a 68 y.o. female.   While trimming bushes with a powered trimmer about 1.5 hours ago, patient was stung by 3 bees/wasps.  She was distracted by the stings and lacerated her left second fingertip.  She denies chest pain, shortness of breath, wheezing, hives, difficulty swallowing etc.  She does not recall her last Tdap.  The history is provided by the patient.  Laceration Location:  Finger Finger laceration location:  L index finger Length:  2 cm Depth:  Through underlying tissue Quality: stellate   Bleeding: controlled   Time since incident:  90 minutes Laceration mechanism:  Metal edge Pain details:    Quality:  Aching   Severity:  Mild   Timing:  Constant   Progression:  Unchanged Foreign body present:  No foreign bodies Relieved by:  None tried Worsened by:  Movement Ineffective treatments:  None tried Tetanus status:  Out of date Associated symptoms: no numbness and no swelling     Past Medical History:  Diagnosis Date  . COPD (chronic obstructive pulmonary disease) ([place])   . Melanoma ([place])   . Seasonal allergies     Patient Active Problem List   Diagnosis Date Noted  . Bilateral impacted cerumen 01/28/2018  . Pulmonary nodule, right 12/26/2016  . Emphysema lung ([place]) 12/26/2016  . Coronary atherosclerosis 12/26/2016  . Aortic atherosclerosis ([place]) 12/26/2016  . Hyperplastic colon polyp 12/23/2016  . Essential hypertension 12/23/2016  . Basal cell carcinoma of left lower leg 03/22/2016  . Cervical spondylosis with radiculopathy 11/06/2015  . Hyperlipidemia 06/06/2015  . Hypertriglyceridemia 06/06/2015  . Elevated blood pressure 05/17/2015  . Stress at home 05/17/2015  . Cerumen impaction 05/17/2015  . Primary osteoarthritis of both knees 11/29/2014  .  Fracture of radial head, right, closed 10/08/2013  . Left wrist sprain 10/08/2013  . Low back pain 04/14/2012  . FELON 05/27/2011  . CHRONIC OBSTRUCTIVE PULMONARY DISEASE, ACUTE 10/04/2010  . PNEUMONIA, ORGANISM UNSPECIFIED 09/20/2010  . SINUSITIS 09/22/2009  . MAMMOGRAM, ABNORMAL 07/18/2009  . HYPERLIPIDEMIA 05/18/2009  . Postmenopausal 05/18/2009  . Melanoma of skin ([place]) 04/20/2009  . EUSTACHIAN TUBE DYSFUNCTION, RIGHT 04/20/2009    Past Surgical History:  Procedure Laterality Date  . ABDOMINAL HYSTERECTOMY    . APPENDECTOMY    . OVARIAN CYST REMOVAL      OB History   No obstetric history on file.      Home Medications    Prior to Admission medications   Medication Sig Start Date End Date Taking? Authorizing Provider  atorvastatin (LIPITOR) 40 MG tablet Take 1 tablet (40 mg total) by mouth daily. 02/20/18   Breeback, Jade L, PA-C  cephALEXin (KEFLEX) 500 MG capsule Take 1 capsule (500 mg total) by mouth 2 (two) times daily. 03/20/19   Kandra Nicolas, MD  Omega-3 Fatty Acids (FISH OIL) 1000 MG CAPS Take by mouth.    [provider]  vitamin B-12 (CYANOCOBALAMIN) 1000 MCG tablet Take 1,000 mcg by mouth daily.    [provider]  VITAMIN D, CHOLECALCIFEROL, PO Take 5,000 Units 3 (three) times daily by mouth.     [provider]    Family History Family History  Problem Relation Age of Onset  . Cancer Mother        breast  .  Hyperlipidemia Mother   . Heart disease Father   . Cancer Sister   . Seizures Sister   . Cancer Maternal Grandmother        breast  . Cancer Maternal Grandfather        colon    Social History Social History   Tobacco Use  . Smoking status: Current Every Day Smoker    Packs/day: 1.00    Years: 51.00    Pack years: 51.00    Types: Cigarettes  . Smokeless tobacco: Never Used  Substance Use Topics  . Alcohol use: Yes    Alcohol/week: 1.0 standard drinks    Types: 1 Glasses of wine per week    Comment: a night  with different  . Drug use: No     Allergies   Influenza vaccines, Morphine and related, and Ace inhibitors   Review of Systems Review of Systems  HENT: Negative for trouble swallowing.   Respiratory: Negative for cough, chest tightness, shortness of breath, wheezing and stridor.   Cardiovascular: Negative for chest pain.  Skin: Positive for wound.  All other systems reviewed and are negative.    Physical Exam Triage Vital Signs ED Triage Vitals  Enc Vitals Group     BP 03/20/19 1227 (!) 166/92     Pulse Rate 03/20/19 1227 (!) 113     Resp 03/20/19 1227 18     Temp 03/20/19 1227 (!) 97.5 F (36.4 C)     Temp Source 03/20/19 1227 Oral     SpO2 03/20/19 1227 96 %     Weight 03/20/19 1229 176 lb 5.9 oz (80 kg)     Height 03/20/19 1229 5\' 5"  (1.651 m)     Head Circumference --      Peak Flow --      Pain Score 03/20/19 1229 5     Pain Loc --      Pain Edu? --      Excl. in [place]? --    No data found.  Updated Vital Signs BP (!) 166/92 (BP Location: Right Arm)   Pulse (!) 113   Temp (!) 97.5 F (36.4 C) (Oral)   Resp 18   Ht 5\' 5"  (1.651 m)   Wt 80 kg   SpO2 96%   BMI 29.35 kg/m   Visual Acuity Right Eye Distance:   Left Eye Distance:   Bilateral Distance:    Right Eye Near:   Left Eye Near:    Bilateral Near:     Physical Exam Vitals signs and nursing note reviewed.  Constitutional:      General: She is not in acute distress. HENT:     Head: Normocephalic.     Right Ear: External ear normal.     Left Ear: External ear normal.     Nose: Nose normal.     Mouth/Throat:     Pharynx: Oropharynx is clear.  Eyes:     Pupils: Pupils are equal, round, and reactive to light.  Neck:     Musculoskeletal: Normal range of motion.  Cardiovascular:     Rate and Rhythm: Tachycardia present.  Pulmonary:     Breath sounds: Normal breath sounds. No stridor. No wheezing.  Musculoskeletal:        General: No swelling.     Left hand: She exhibits laceration. She  exhibits normal range of motion, no tenderness, no bony tenderness, normal two-point discrimination, normal capillary refill, no deformity and no swelling. Normal sensation noted.  Hands:     Comments: Left second finger has a 2cm Y-shaped laceration  as noted on diagram.   2cm  #7  4-0  Skin:    General: Skin is dry.     Findings: No rash.  Neurological:     Mental Status: She is alert and oriented to person, place, and time.      UC Treatments / Results  Labs (all labs ordered are listed, but only abnormal results are displayed) Labs Reviewed - No data to display  EKG   Radiology No results found.  Procedures Procedures   Laceration Repair Discussed benefits and risks of procedure and verbal consent obtained. Using sterile technique and digital anesthesia with 2% lidocaine without epinephrine, cleansed wound with Betadine followed by copious lavage with normal saline.  Wound carefully inspected for debris and foreign bodies; none found.  Wound closed with # 7 4-0 interrupted nylon sutures.  Applied Xeroform dressing followed by Kerlix gauze and CoBan wrap.  Wound precautions explained to patient.  Return tomorrow for wound check.  Return for suture removal in 10 days.   Medications Ordered in UC Medications  Tdap (BOOSTRIX) injection 0.5 mL (0.5 mLs Intramuscular Given 03/20/19 1319)  ibuprofen (ADVIL) tablet 600 mg (600 mg Oral Given 03/20/19 1245)  diphenhydrAMINE (BENADRYL) capsule 25 mg (25 mg Oral Given 03/20/19 1245)    Initial Impression / Assessment and Plan / UC Course  I have reviewed the triage vital signs and the nursing notes.  Pertinent labs & imaging results that were available during my care of the patient were reviewed by me and considered in my medical decision making (see chart for details).    Administered Tdap.  Administered Benadryl 25mg  PO  Begin empiric Keftlex Return tomorrow for follow-up.   Final Clinical Impressions(s) / UC Diagnoses    Final diagnoses:  Laceration of left index finger without damage to nail, initial encounter  Insect stings, accidental or unintentional, initial encounter     Discharge Instructions     Keep wound clean and dry.  Elevate hand.  Return tomorrow for dressing change.  May take Ibuprofen 200mg , 4 tabs every 8 hours with food if needed for pain.    ED Prescriptions    Medication Sig Dispense Auth. Provider   cephALEXin (KEFLEX) 500 MG capsule Take 1 capsule (500 mg total) by mouth 2 (two) times daily. 20 capsule Kandra Nicolas, MD         Kandra Nicolas, MD 03/21/19 1125

## 2019-03-20 NOTE — ED Triage Notes (Signed)
Patient was doing yard work and just cut her left index finger at first joint with hedge clippers; was also stung by 3 bees/wasps; never stung before so does not know if she has allergies. She has not had Tdap in past 6 years. She has not travelled in past 4 weeks.

## 2019-03-20 NOTE — Discharge Instructions (Addendum)
Keep wound clean and dry.  Elevate hand.  Return tomorrow for dressing change.  May take Ibuprofen 200mg , 4 tabs every 8 hours with food if needed for pain.

## 2019-03-21 ENCOUNTER — Encounter: Payer: Self-pay | Admitting: Emergency Medicine

## 2019-03-21 ENCOUNTER — Emergency Department (INDEPENDENT_AMBULATORY_CARE_PROVIDER_SITE_OTHER)
Admission: EM | Admit: 2019-03-21 | Discharge: 2019-03-21 | Disposition: A | Discharge status: Home / Self Care | Payer: Medicare Other | Source: Home / Self Care | Attending: Family Medicine | Admitting: Family Medicine

## 2019-03-21 ENCOUNTER — Other Ambulatory Visit: Payer: Self-pay

## 2019-03-21 DIAGNOSIS — Z5189 Encounter for other specified aftercare: Secondary | ICD-10-CM

## 2019-03-21 NOTE — Discharge Instructions (Addendum)
Change dressing daily and apply Bacitracin ointment to wound.  Keep wound clean and dry.  Return for any signs of infection (or follow-up with family doctor):  Increasing redness, swelling, pain, heat, drainage, etc.   May continue Benadryl 25mg , 2 caps or tabs every 6 hours for bee sting reaction and itching.

## 2019-03-21 NOTE — ED Triage Notes (Signed)
Here for wound check left index laceration with sutures from yesterday. States no pain.

## 2019-03-21 NOTE — ED Provider Notes (Signed)
[name]    CSN: [id number] Arrival date & time: 03/21/19  1100     History   Chief Complaint Chief Complaint  Patient presents with  . Wound Check    HPI Sherl Yzaguirre is a 68 y.o. female.   Patient returns for follow-up of laceration repair left second finger.  She denies pain/swelling.  She reports that she has had itching from bee stings, controlled with Benadryl.  She denies shortness of breath or chest symptoms.  She denies presence of hives.  The history is provided by the patient.    Past Medical History:  Diagnosis Date  . COPD (chronic obstructive pulmonary disease) ([place])   . Melanoma ([place])   . Seasonal allergies     Patient Active Problem List   Diagnosis Date Noted  . Bilateral impacted cerumen 01/28/2018  . Pulmonary nodule, right 12/26/2016  . Emphysema lung ([place]) 12/26/2016  . Coronary atherosclerosis 12/26/2016  . Aortic atherosclerosis ([place]) 12/26/2016  . Hyperplastic colon polyp 12/23/2016  . Essential hypertension 12/23/2016  . Basal cell carcinoma of left lower leg 03/22/2016  . Cervical spondylosis with radiculopathy 11/06/2015  . Hyperlipidemia 06/06/2015  . Hypertriglyceridemia 06/06/2015  . Elevated blood pressure 05/17/2015  . Stress at home 05/17/2015  . Cerumen impaction 05/17/2015  . Primary osteoarthritis of both knees 11/29/2014  . Fracture of radial head, right, closed 10/08/2013  . Left wrist sprain 10/08/2013  . Low back pain 04/14/2012  . FELON 05/27/2011  . CHRONIC OBSTRUCTIVE PULMONARY DISEASE, ACUTE 10/04/2010  . PNEUMONIA, ORGANISM UNSPECIFIED 09/20/2010  . SINUSITIS 09/22/2009  . MAMMOGRAM, ABNORMAL 07/18/2009  . HYPERLIPIDEMIA 05/18/2009  . Postmenopausal 05/18/2009  . Melanoma of skin ([place]) 04/20/2009  . EUSTACHIAN TUBE DYSFUNCTION, RIGHT 04/20/2009    Past Surgical History:  Procedure Laterality Date  . ABDOMINAL HYSTERECTOMY    . APPENDECTOMY    . OVARIAN CYST REMOVAL      OB History   No  obstetric history on file.      Home Medications    Prior to Admission medications   Medication Sig Start Date End Date Taking? Authorizing Provider  atorvastatin (LIPITOR) 40 MG tablet Take 1 tablet (40 mg total) by mouth daily. 02/20/18   Breeback, Jade L, PA-C  cephALEXin (KEFLEX) 500 MG capsule Take 1 capsule (500 mg total) by mouth 2 (two) times daily. 03/20/19   Kandra Nicolas, MD  Omega-3 Fatty Acids (FISH OIL) 1000 MG CAPS Take by mouth.    [provider]  vitamin B-12 (CYANOCOBALAMIN) 1000 MCG tablet Take 1,000 mcg by mouth daily.    [provider]  VITAMIN D, CHOLECALCIFEROL, PO Take 5,000 Units 3 (three) times daily by mouth.     [provider]    Family History Family History  Problem Relation Age of Onset  . Cancer Mother        breast  . Hyperlipidemia Mother   . Heart disease Father   . Cancer Sister   . Seizures Sister   . Cancer Maternal Grandmother        breast  . Cancer Maternal Grandfather        colon    Social History Social History   Tobacco Use  . Smoking status: Current Every Day Smoker    Packs/day: 1.00    Years: 51.00    Pack years: 51.00    Types: Cigarettes  . Smokeless tobacco: Never Used  Substance Use Topics  . Alcohol use: Yes    Alcohol/week:  1.0 standard drinks    Types: 1 Glasses of wine per week    Comment: a night with different  . Drug use: No     Allergies   Influenza vaccines, Morphine and related, and Ace inhibitors   Review of Systems Review of Systems  Constitutional: Negative for chills, diaphoresis, fatigue and fever.  HENT: Negative for trouble swallowing.   Respiratory: Negative for cough, chest tightness, wheezing and stridor.      Physical Exam Triage Vital Signs ED Triage Vitals  Enc Vitals Group     BP 03/21/19 1120 135/76     Pulse Rate 03/21/19 1120 77     Resp 03/21/19 1120 16     Temp 03/21/19 1120 98.2 F (36.8 C)     Temp Source 03/21/19 1120 Oral     SpO2  03/21/19 1120 96 %     Weight --      Height --      Head Circumference --      Peak Flow --      Pain Score 03/21/19 1121 0     Pain Loc --      Pain Edu? --      Excl. in [place]? --    No data found.  Updated Vital Signs BP 135/76 (BP Location: Right Arm)   Pulse 77   Temp 98.2 F (36.8 C) (Oral)   Resp 16   SpO2 96%   Visual Acuity Right Eye Distance:   Left Eye Distance:   Bilateral Distance:    Right Eye Near:   Left Eye Near:    Bilateral Near:     Physical Exam Patient appears comfortable and in no distress. Laceration repair left second finger has no swelling, erythema, or drainage.  UC Treatments / Results  Labs (all labs ordered are listed, but only abnormal results are displayed) Labs Reviewed - No data to display  EKG   Radiology No results found.  Procedures Procedures (including critical care time)  Medications Ordered in UC Medications - No data to display  Initial Impression / Assessment and Plan / UC Course  I have reviewed the triage vital signs and the nursing notes.  Pertinent labs & imaging results that were available during my care of the patient were reviewed by me and considered in my medical decision making (see chart for details).    She appears to have had no significant reaction from bee stings. There is no evidence of bacterial infection today.   Re-wrapped wound with Xeroform, Kerlix gauze, and CoBan wrap. Followup with Family Doctor in 8 days for suture removal   Final Clinical Impressions(s) / UC Diagnoses   Final diagnoses:  Visit for wound check     Discharge Instructions     Change dressing daily and apply Bacitracin ointment to wound.  Keep wound clean and dry.  Return for any signs of infection (or follow-up with family doctor):  Increasing redness, swelling, pain, heat, drainage, etc.   May continue Benadryl 25mg , 2 caps or tabs every 6 hours for bee sting reaction and itching.    ED Prescriptions    None          Kandra Nicolas, MD 03/21/19 1142

## 2019-03-23 ENCOUNTER — Telehealth: Payer: Self-pay | Admitting: Family Medicine

## 2019-03-23 NOTE — Telephone Encounter (Signed)
Called and spoke with patient.  She changed her dressing today and she has no redness, swelling, pain or drainage.  She is doing great!!  Will follow-up with her PCP for suture removal.

## 2019-03-30 ENCOUNTER — Other Ambulatory Visit: Payer: Self-pay

## 2019-03-30 ENCOUNTER — Ambulatory Visit (INDEPENDENT_AMBULATORY_CARE_PROVIDER_SITE_OTHER): Payer: Medicare Other | Admitting: Family Medicine

## 2019-03-30 ENCOUNTER — Encounter: Payer: Self-pay | Admitting: Family Medicine

## 2019-03-30 VITALS — BP 138/79 | HR 85 | Temp 98.5°F | Ht 65.0 in | Wt 180.0 lb

## 2019-03-30 DIAGNOSIS — H6123 Impacted cerumen, bilateral: Secondary | ICD-10-CM | POA: Diagnosis not present

## 2019-03-30 DIAGNOSIS — Z4802 Encounter for removal of sutures: Secondary | ICD-10-CM

## 2019-03-30 DIAGNOSIS — Z23 Encounter for immunization: Secondary | ICD-10-CM

## 2019-03-30 NOTE — Progress Notes (Signed)
Acute Office Visit  Subjective:    Patient ID: Olivia Frank, female    DOB: August 19, 1951, 68 y.o.   MRN: [id number]  Chief Complaint  Patient presents with  . Suture / Staple Removal  . Cerumen Impaction    HPI Patient is in today for suture removal from index finger of left hand. She cut her finger on in a hedge trimmer. She had multiple suture.  But still very tender and swollen.  Just had a little bit of bloody drainage but no sign of fevers chills or sweats.  she has been cleaning it with peroxide and applying triple antibiotic ointment.  She also wanted her ears looked at today.  She says usually at least once a year she needs to get them irrigated she has been trying to do the Debrox drops at least once a week.  She says her right ear has bothered her the most it feels plugged.   Past Medical History:  Diagnosis Date  . COPD (chronic obstructive pulmonary disease) ([place])   . Melanoma ([place])   . Seasonal allergies     Past Surgical History:  Procedure Laterality Date  . ABDOMINAL HYSTERECTOMY    . APPENDECTOMY    . OVARIAN CYST REMOVAL      Family History  Problem Relation Age of Onset  . Cancer Mother        breast  . Hyperlipidemia Mother   . Heart disease Father   . Cancer Sister   . Seizures Sister   . Cancer Maternal Grandmother        breast  . Cancer Maternal Grandfather        colon    Social History   Socioeconomic History  . Marital status: Married    Spouse name: Elberta Fortis  . Number of children: 2  . Years of education: 63  . Highest education level: 12th grade  Occupational History  . Occupation: retired    Comment: Air cabin crew for [employer]  . Financial resource strain: Not hard at all  . Food insecurity    Worry: Never true    Inability: Never true  . Transportation needs    Medical: No    Non-medical: No  Tobacco Use  . Smoking status: Current Every Day Smoker    Packs/day: 1.00    Years: 51.00    Pack  years: 51.00    Types: Cigarettes  . Smokeless tobacco: Never Used  Substance and Sexual Activity  . Alcohol use: Yes    Alcohol/week: 1.0 standard drinks    Types: 1 Glasses of wine per week    Comment: a night with different  . Drug use: No  . Sexual activity: Not Currently  Lifestyle  . Physical activity    Days per week: 3 days    Minutes per session: 120 min  . Stress: Only a little  Relationships  . Social Herbalist on phone: More than three times a week    Gets together: Never    Attends religious service: Never    Active member of club or organization: No    Attends meetings of clubs or organizations: Never    Relationship status: Married  . Intimate partner violence    Fear of current or ex partner: No    Emotionally abused: No    Physically abused: No    Forced sexual activity: No  Other Topics Concern  . Not on file  Social History Narrative  Goes to the [facility] for exercise 3 times a week. Takes care of her husband who has vascular dementia and son who is on disability so has a lot on her plate on a daily basis but manageing    Outpatient Medications Prior to Visit  Medication Sig Dispense Refill  . atorvastatin (LIPITOR) 40 MG tablet Take 1 tablet (40 mg total) by mouth daily. 90 tablet 4  . Omega-3 Fatty Acids (FISH OIL) 1000 MG CAPS Take by mouth.    . vitamin B-12 (CYANOCOBALAMIN) 1000 MCG tablet Take 1,000 mcg by mouth daily.    [name] VITAMIN D, CHOLECALCIFEROL, PO Take 5,000 Units 3 (three) times daily by mouth.     . cephALEXin (KEFLEX) 500 MG capsule Take 1 capsule (500 mg total) by mouth 2 (two) times daily. 20 capsule 0   No facility-administered medications prior to visit.     Allergies  Allergen Reactions  . Morphine And Related Hives and Itching  . Ace Inhibitors     cough    ROS     Objective:    Physical Exam  Musculoskeletal:     Comments: Left index finger has multiple stitches in place.  The skin is a little bit swollen and  very tender she has a little bit of bloody drainage.    BP 138/79   Pulse 85   Temp 98.5 F (36.9 C)   Ht 5\' 5"  (1.651 m)   Wt 180 lb (81.6 kg)   SpO2 97%   BMI 29.95 kg/m  Wt Readings from Last 3 Encounters:  03/30/19 180 lb (81.6 kg)  03/20/19 176 lb 5.9 oz (80 kg)  08/31/18 178 lb (80.7 kg)    There are no preventive care reminders to display for this patient.  There are no preventive care reminders to display for this patient.   No results found for: TSH Lab Results  Component Value Date   WBC 9.4 02/18/2018   HGB 15.9 (H) 02/18/2018   HCT 46.6 (H) 02/18/2018   MCV 92.8 02/18/2018   PLT 287 02/18/2018   Lab Results  Component Value Date   NA 141 02/18/2018   K 4.2 02/18/2018   CO2 28 02/18/2018   GLUCOSE 89 02/18/2018   BUN 13 02/18/2018   CREATININE 0.66 02/18/2018   BILITOT 0.6 02/18/2018   ALKPHOS 86 11/25/2016   AST 22 02/18/2018   ALT 27 02/18/2018   PROT 6.5 02/18/2018   ALBUMIN 4.1 11/25/2016   CALCIUM 9.5 02/18/2018   Lab Results  Component Value Date   CHOL 181 02/18/2018   Lab Results  Component Value Date   HDL 62 02/18/2018   Lab Results  Component Value Date   LDLCALC 86 02/18/2018   Lab Results  Component Value Date   TRIG 241 (H) 02/18/2018   Lab Results  Component Value Date   CHOLHDL 2.9 02/18/2018   No results found for: HGBA1C     Assessment & Plan:   Problem List Items Addressed This Visit      Nervous and Auditory   Bilateral impacted cerumen    Other Visit Diagnoses    Need for immunization against influenza    -  Primary   Visit for suture removal         Suture removal-I do feel like they are not quite ready to come out.  I rather give it another 2 days.  We will schedule her for Thursday morning.  In the meantime keep loosely covered during  the daytime and okay to keep uncovered at night.  Just apply small dab of Vaseline twice a day to moisturize the area.  Discontinue the use of alcohol or  peroxide  Indication: Cerumen impaction of the ear(s) Medical necessity statement: On physical examination, cerumen impairs clinically significant portions of the external auditory canal, and tympanic membrane. Noted obstructive, copious cerumen that cannot be removed without magnification and instrumentations requiring physician skills Consent: Discussed benefits and risks of procedure and verbal consent obtained Procedure: Patient was prepped for the procedure. Utilized an otoscope to assess and take note of the ear canal, the tympanic membrane, and the presence, amount, and placement of the cerumen. Gentle water irrigation and soft plastic curette was utilized to remove cerumen.  Post procedure examination: shows cerumen was completely removed. Patient tolerated procedure well. The patient is made aware that they may experience temporary vertigo, temporary hearing loss, and temporary discomfort. If these symptom last for more than 24 hours to call the clinic or proceed to the ED.     No orders of the defined types were placed in this encounter.    Beatrice Lecher, MD

## 2019-03-30 NOTE — Patient Instructions (Signed)
Please schedule for 7:50 on Thur AM for suture removal.

## 2019-04-01 ENCOUNTER — Other Ambulatory Visit: Payer: Self-pay

## 2019-04-01 ENCOUNTER — Encounter: Payer: Self-pay | Admitting: Family Medicine

## 2019-04-01 ENCOUNTER — Ambulatory Visit (INDEPENDENT_AMBULATORY_CARE_PROVIDER_SITE_OTHER): Payer: Medicare Other | Admitting: Family Medicine

## 2019-04-01 VITALS — BP 157/69 | HR 83 | Ht 65.0 in | Wt 178.0 lb

## 2019-04-01 DIAGNOSIS — Z23 Encounter for immunization: Secondary | ICD-10-CM

## 2019-04-01 DIAGNOSIS — Z4802 Encounter for removal of sutures: Secondary | ICD-10-CM | POA: Diagnosis not present

## 2019-04-01 DIAGNOSIS — S61311A Laceration without foreign body of left index finger with damage to nail, initial encounter: Secondary | ICD-10-CM | POA: Diagnosis not present

## 2019-04-01 NOTE — Progress Notes (Signed)
Established Patient Office Visit  Subjective:  Patient ID: Olivia Frank, female    DOB: 1951/06/15  Age: 68 y.o. MRN: [id number]  CC:  Chief Complaint  Patient presents with  . Suture / Staple Removal    HPI Olivia Frank presents for suture removal from index finger of the left hand.  She is doing well. She has been placing vaseline on it for the last 2 days. It is still very tender.    Past Medical History:  Diagnosis Date  . COPD (chronic obstructive pulmonary disease) ([place])   . Melanoma ([place])   . Seasonal allergies     Past Surgical History:  Procedure Laterality Date  . ABDOMINAL HYSTERECTOMY    . APPENDECTOMY    . OVARIAN CYST REMOVAL      Family History  Problem Relation Age of Onset  . Cancer Mother        breast  . Hyperlipidemia Mother   . Heart disease Father   . Cancer Sister   . Seizures Sister   . Cancer Maternal Grandmother        breast  . Cancer Maternal Grandfather        colon    Social History   Socioeconomic History  . Marital status: Married    Spouse name: Elberta Fortis  . Number of children: 2  . Years of education: 31  . Highest education level: 12th grade  Occupational History  . 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 Financial resource strain: Not hard at all  . Food insecurity    Worry: Never true    Inability: Never true  . Transportation needs    Medical: No    Non-medical: No  Tobacco Use  . Smoking status: Current Every Day Smoker    Packs/day: 1.00    Years: 51.00    Pack years: 51.00    Types: Cigarettes  . Smokeless tobacco: Never Used  Substance and Sexual Activity  . Alcohol use: Yes    Alcohol/week: 1.0 standard drinks    Types: 1 Glasses of wine per week    Comment: a night with different  . Drug use: No  . Sexual activity: Not Currently  Lifestyle  . Physical activity    Days per week: 3 days    Minutes per session: 120 min  . Stress: Only a little   Relationships  . Social Herbalist on phone: More than three times a week    Gets together: Never    Attends religious service: Never    Active member of club or organization: No    Attends meetings of clubs or organizations: Never    Relationship status: Married  . Intimate partner violence    Fear of current or ex partner: No    Emotionally abused: No    Physically abused: No    Forced sexual activity: No  Other Topics Concern  . Not on file  Social History Narrative   Goes to the [facility] for exercise 3 times a week. Takes care of her husband who has vascular dementia and son who is on disability so has a lot on her plate on a daily basis but manageing    Outpatient Medications Prior to Visit  Medication Sig Dispense Refill  . atorvastatin (LIPITOR) 40 MG tablet Take 1 tablet (40 mg total) by mouth daily. 90 tablet 4  . Omega-3 Fatty Acids (FISH OIL) 1000 MG CAPS  Take by mouth.    . vitamin B-12 (CYANOCOBALAMIN) 1000 MCG tablet Take 1,000 mcg by mouth daily.    [name] VITAMIN D, CHOLECALCIFEROL, PO Take 5,000 Units 3 (three) times daily by mouth.      No facility-administered medications prior to visit.     Allergies  Allergen Reactions  . Morphine And Related Hives and Itching  . Ace Inhibitors     cough    ROS Review of Systems    Objective:    Physical Exam  Constitutional: She is oriented to person, place, and time. She appears well-developed and well-nourished.  HENT:  Head: Normocephalic and atraumatic.  Eyes: Conjunctivae and EOM are normal.  Cardiovascular: Normal rate.  Pulmonary/Chest: Effort normal.  Neurological: She is alert and oriented to person, place, and time.  Skin: Skin is dry. No pallor.  Sutures were easily removed from her fingertip.  Some of the skin is peeling away and will likely peel off completely.  But no active drainage from the wound.  Psychiatric: She has a normal mood and affect. Her behavior is normal.  Vitals  reviewed.   BP (!) 157/69   Pulse 83   Ht 5\' 5"  (1.651 m)   Wt 178 lb (80.7 kg)   SpO2 96%   BMI 29.62 kg/m  Wt Readings from Last 3 Encounters:  04/01/19 178 lb (80.7 kg)  03/30/19 180 lb (81.6 kg)  03/20/19 176 lb 5.9 oz (80 kg)     There are no preventive care reminders to display for this patient.  There are no preventive care reminders to display for this patient.  No results found for: TSH Lab Results  Component Value Date   WBC 9.4 02/18/2018   HGB 15.9 (H) 02/18/2018   HCT 46.6 (H) 02/18/2018   MCV 92.8 02/18/2018   PLT 287 02/18/2018   Lab Results  Component Value Date   NA 141 02/18/2018   K 4.2 02/18/2018   CO2 28 02/18/2018   GLUCOSE 89 02/18/2018   BUN 13 02/18/2018   CREATININE 0.66 02/18/2018   BILITOT 0.6 02/18/2018   ALKPHOS 86 11/25/2016   AST 22 02/18/2018   ALT 27 02/18/2018   PROT 6.5 02/18/2018   ALBUMIN 4.1 11/25/2016   CALCIUM 9.5 02/18/2018   Lab Results  Component Value Date   CHOL 181 02/18/2018   Lab Results  Component Value Date   HDL 62 02/18/2018   Lab Results  Component Value Date   LDLCALC 86 02/18/2018   Lab Results  Component Value Date   TRIG 241 (H) 02/18/2018   Lab Results  Component Value Date   CHOLHDL 2.9 02/18/2018   No results found for: HGBA1C    Assessment & Plan:   Problem List Items Addressed This Visit    None    Visit Diagnoses    Visit for suture removal    -  Primary   Laceration of left index finger without foreign body with damage to nail, initial encounter         Finger laceration-sutures easily removed.  Patient tolerated well.  Apply Vaseline twice a day.  Call if any concerns or problems.  Call if any problems with wound care.  I suspect she will probably have a pretty decent scar on the finger once completely healed.  No orders of the defined types were placed in this encounter.   Follow-up: Return if symptoms worsen or fail to improve.    Beatrice Lecher, MD

## 2019-04-06 DIAGNOSIS — Z23 Encounter for immunization: Secondary | ICD-10-CM | POA: Diagnosis not present

## 2019-04-20 DIAGNOSIS — Z85820 Personal history of malignant melanoma of skin: Secondary | ICD-10-CM | POA: Diagnosis not present

## 2019-04-20 DIAGNOSIS — Z85828 Personal history of other malignant neoplasm of skin: Secondary | ICD-10-CM | POA: Diagnosis not present

## 2019-04-20 DIAGNOSIS — L570 Actinic keratosis: Secondary | ICD-10-CM | POA: Diagnosis not present

## 2019-04-20 DIAGNOSIS — L989 Disorder of the skin and subcutaneous tissue, unspecified: Secondary | ICD-10-CM | POA: Diagnosis not present

## 2019-04-20 DIAGNOSIS — L821 Other seborrheic keratosis: Secondary | ICD-10-CM | POA: Diagnosis not present

## 2019-04-20 DIAGNOSIS — D225 Melanocytic nevi of trunk: Secondary | ICD-10-CM | POA: Diagnosis not present

## 2019-04-20 DIAGNOSIS — D485 Neoplasm of uncertain behavior of skin: Secondary | ICD-10-CM | POA: Diagnosis not present

## 2019-04-20 DIAGNOSIS — L905 Scar conditions and fibrosis of skin: Secondary | ICD-10-CM | POA: Diagnosis not present

## 2019-05-09 ENCOUNTER — Other Ambulatory Visit: Payer: Self-pay | Admitting: Physician Assistant

## 2019-05-09 DIAGNOSIS — E7800 Pure hypercholesterolemia, unspecified: Secondary | ICD-10-CM

## 2019-06-09 DIAGNOSIS — D485 Neoplasm of uncertain behavior of skin: Secondary | ICD-10-CM | POA: Diagnosis not present

## 2019-06-09 DIAGNOSIS — D225 Melanocytic nevi of trunk: Secondary | ICD-10-CM | POA: Diagnosis not present

## 2019-07-14 ENCOUNTER — Other Ambulatory Visit: Payer: Self-pay

## 2019-07-28 NOTE — Progress Notes (Signed)
Subjective:   Olivia Frank is a 68 y.o. female who presents for Medicare Annual (Subsequent) preventive examination.  Review of Systems:  No ROS.  Medicare Wellness Virtual Visit.  Visual/audio telehealth visit, UTA vital signs.   See social history for additional risk factors.    Cardiac Risk Factors include: advanced age (>41men, >100 women);smoking/ tobacco exposure;hypertension Sleep patterns: Getting 8 hours of sleep a night. Wakes up occasionally to go to the bathroom 1 time. Wakes up and feels rested and ready for the day. Home Safety/Smoke Alarms: Feels safe in home. Smoke alarms in place.  Living environment; Lives with husband and disabled son in a 1 story home. Steps to basement have handrails on them. Shower is a walk in shower with grab bars in place and a bench in place. Seat Belt Safety/Bike Helmet: Wears seat belt.   Female:   Pap- Aged out      Mammo- UTD      Dexa scan-  UTD      CCS- UTD     Objective:     Vitals: BP 132/75   Temp 98 F (36.7 C) (Oral)   Ht 5\' 4"  (1.626 m)   Wt 172 lb (78 kg)   BMI 29.52 kg/m   Body mass index is 29.52 kg/m.  Advanced Directives 08/03/2019 07/29/2018 11/06/2015 05/17/2015  Does Patient Have a Medical Advance Directive? Yes Yes No No  Type of Paramedic of [place];Living will [place];Living will - -  Does patient want to make changes to medical advance directive? No - Patient declined No - Patient declined - -  Copy of [place] in Chart? No - copy requested No - copy requested - -  Would patient like information on creating a medical advance directive? - - No - patient declined information Yes - Educational materials given    Tobacco Social History   Tobacco Use  Smoking Status Current Every Day Smoker  . Packs/day: 1.00  . Years: 51.00  . Pack years: 51.00  . Types: Cigarettes  Smokeless Tobacco Never Used     Ready to quit: No Counseling  given: Not Answered   Clinical Intake:  Pre-visit preparation completed: Yes  Pain : No/denies pain     Nutritional Risks: None Diabetes: No  How often do you need to have someone help you when you read instructions, pamphlets, or other written materials from your doctor or pharmacy?: 1 - Never What is the last grade level you completed in school?: 12  Interpreter Needed?: No  Information entered by :: Orlie Dakin, LPN  Past Medical History:  Diagnosis Date  . COPD (chronic obstructive pulmonary disease) ([place])   . Melanoma ([place])   . Seasonal allergies    Past Surgical History:  Procedure Laterality Date  . ABDOMINAL HYSTERECTOMY    . APPENDECTOMY    . OVARIAN CYST REMOVAL     Family History  Problem Relation Age of Onset  . Cancer Mother        breast  . Hyperlipidemia Mother   . Heart disease Father   . Cancer Sister   . Seizures Sister   . Cancer Maternal Grandmother        breast  . Cancer Maternal Grandfather        colon   Social History   Socioeconomic History  . Marital status: Married    Spouse name: Olivia Frank  . Number of children: 2  . Years of education:  12  . Highest education level: 12th grade  Occupational History  . 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 Smoking status: Current Every Day Smoker    Packs/day: 1.00    Years: 51.00    Pack years: 51.00    Types: Cigarettes  . Smokeless tobacco: Never Used  Substance and Sexual Activity  . Alcohol use: Yes    Alcohol/week: 1.0 standard drinks    Types: 1 Glasses of wine per week    Comment: a night with different  . Drug use: No  . Sexual activity: Not Currently  Other Topics Concern  . Not on file  Social History Narrative   . Takes care of her husband who has vascular dementia and son who is on disability so has a lot on her plate on a daily basis but manageing.   Social Determinants of Health   Financial Resource Strain:   .  Difficulty of Paying Living Expenses: Not on file  Food Insecurity:   . Worried About Charity fundraiser in the Last Year: Not on file  . Ran Out of Food in the Last Year: Not on file  Transportation Needs:   . Lack of Transportation (Medical): Not on file  . Lack of Transportation (Non-Medical): Not on file  Physical Activity:   . Days of Exercise per Week: Not on file  . Minutes of Exercise per Session: Not on file  Stress:   . Feeling of Stress : Not on file  Social Connections:   . Frequency of Communication with Friends and Family: Not on file  . Frequency of Social Gatherings with Friends and Family: Not on file  . Attends Religious Services: Not on file  . Active Member of Clubs or Organizations: Not on file  . Attends Archivist Meetings: Not on file  . Marital Status: Not on file    Outpatient Encounter Medications as of 08/03/2019  Medication Sig  . atorvastatin (LIPITOR) 40 MG tablet Take 1 tablet (40 mg total) by mouth daily. NEEDS APPT/LABS  . Omega-3 Fatty Acids (FISH OIL) 1000 MG CAPS Take by mouth.  . vitamin B-12 (CYANOCOBALAMIN) 1000 MCG tablet Take 1,000 mcg by mouth daily.  [name] VITAMIN D, CHOLECALCIFEROL, PO Take 5,000 Units 3 (three) times daily by mouth.    No facility-administered encounter medications on file as of 08/03/2019.    Activities of Daily Living In your present state of health, do you have any difficulty performing the following activities: 08/03/2019  Hearing? N  Vision? N  Difficulty concentrating or making decisions? N  Walking or climbing stairs? N  Dressing or bathing? N  Doing errands, shopping? N  Preparing Food and eating ? N  Using the Toilet? N  In the past six months, have you accidently leaked urine? Y  Comment only if coughs or sneezes  Do you have problems with loss of bowel control? N  Managing your Medications? N  Managing your Finances? N  Housekeeping or managing your Housekeeping? N  Some recent data might  be hidden    Patient Care Team: Lavada Mesi as PCP - General (Family Medicine)    Assessment:   This is a routine wellness examination for Olivia Frank.Physical assessment deferred to PCP.   Exercise Activities and Dietary recommendations Current Exercise Habits: Home exercise routine, Type of exercise: walking, Time (Minutes): 15, Frequency (Times/Week): 7, Weekly Exercise (Minutes/Week): 105, Intensity: Mild, Exercise limited by: None  identified Diet Eats a healthy diet of fruits and proteins not much vegetables. Breakfast: Bagel or oatmeal with banana Lunch: sandwich or grilled cheese Dinner: Meat and vegetables and a starch Drinks 60 ounces of water daily. Drinks 2-3 cups of hot tea in the morning.       Goals    . Patient Stated     Patient stated she would like to continue to loose weight. Has lost 5 lbs.    . Weight (lb) < 200 lb (90.7 kg)     Patient would like to loose weight in the next year at least 10lbs       Fall Risk Fall Risk  08/03/2019 07/29/2018  Falls in the past year? 0 0  Number falls in past yr: 0 -  Injury with Fall? 0 -  Follow up Falls prevention discussed Falls prevention discussed   Is the patient's home free of loose throw rugs in walkways, pet beds, electrical cords, etc?   yes      Grab bars in the bathroom? yes      Handrails on the stairs?   yes      Adequate lighting?   yes   Depression Screen PHQ 2/9 Scores 08/03/2019 07/29/2018 01/28/2018 11/25/2016  PHQ - 2 Score 0 0 1 1     Cognitive Function     6CIT Screen 08/03/2019 07/29/2018 11/25/2016  What Year? 0 points 0 points 0 points  What month? 0 points 0 points 0 points  What time? 0 points 0 points 0 points  Count back from 20 0 points 0 points 0 points  Months in reverse 0 points 0 points 0 points  Repeat phrase 2 points 0 points 0 points  Total Score 2 0 0    Immunization History  Administered Date(s) Administered  . Fluad Quad(high Dose 65+) 04/06/2019  . Influenza,  High Dose Seasonal PF 05/26/2018  . Influenza,inj,Quad PF,6+ Mos 06/23/2017, 06/24/2017  . Pneumococcal Conjugate-13 11/25/2016  . Pneumococcal Polysaccharide-23 01/28/2018  . Td 05/18/2009  . Tdap 03/20/2019  . Zoster 10/15/2011    Screening Tests Health Maintenance  Topic Date Due  . MAMMOGRAM  03/02/2021  . COLONOSCOPY  12/20/2026  . TETANUS/TDAP  03/19/2029  . INFLUENZA VACCINE  Completed  . DEXA SCAN  Completed  . Hepatitis C Screening  Completed  . PNA vac Low Risk Adult  Completed      Plan:    Please schedule your next medicare wellness visit with me in 1 yr.  Ms. Perra , Thank you for taking time to come for your Medicare Wellness Visit. I appreciate your ongoing commitment to your health goals. Please review the following plan we discussed and let me know if I can assist you in the future. Continue doing brain stimulating activities (puzzles, reading, adult coloring books, staying active) to keep memory sharp.  Bring a copy of your living will and/or healthcare power of attorney to your next office visit.    These are the goals we discussed: Goals    . Patient Stated     Patient stated she would like to continue to loose weight. Has lost 5 lbs.    . Weight (lb) < 200 lb (90.7 kg)     Patient would like to loose weight in the next year at least 10lbs       This is a list of the screening recommended for you and due dates:  Health Maintenance  Topic Date Due  . Mammogram  03/02/2021  . Colon Cancer Screening  12/20/2026  . Tetanus Vaccine  03/19/2029  . Flu Shot  Completed  . DEXA scan (bone density measurement)  Completed  .  Hepatitis C: One time screening is recommended by Center for Disease Control  (CDC) for  adults born from [birth year] through 1965.   Completed  . Pneumonia vaccines  Completed      I have personally reviewed and noted the following in the patient's chart:   . Medical and social history . Use of alcohol, tobacco or illicit drugs   . Current medications and supplements . Functional ability and status . Nutritional status . Physical activity . Advanced directives . List of other physicians . Hospitalizations, surgeries, and ER visits in previous 12 months . Vitals . Screenings to include cognitive, depression, and falls . Referrals and appointments  In addition, I have reviewed and discussed with patient certain preventive protocols, quality metrics, and best practice recommendations. A written personalized care plan for preventive services as well as general preventive health recommendations were provided to patient.     Joanne Chars, LPN  [id number]

## 2019-08-03 ENCOUNTER — Ambulatory Visit (INDEPENDENT_AMBULATORY_CARE_PROVIDER_SITE_OTHER): Payer: Medicare Other | Admitting: *Deleted

## 2019-08-03 VITALS — BP 132/75 | Temp 98.0°F | Ht 64.0 in | Wt 172.0 lb

## 2019-08-03 DIAGNOSIS — Z0000 Encounter for general adult medical examination without abnormal findings: Secondary | ICD-10-CM | POA: Diagnosis not present

## 2019-08-03 NOTE — Patient Instructions (Signed)
Please schedule your next medicare wellness visit with me in 1 yr.  Olivia Frank , Thank you for taking time to come for your Medicare Wellness Visit. I appreciate your ongoing commitment to your health goals. Please review the following plan we discussed and let me know if I can assist you in the future. Continue doing brain stimulating activities (puzzles, reading, adult coloring books, staying active) to keep memory sharp.  Bring a copy of your living will and/or healthcare power of attorney to your next office visit.  These are the goals we discussed: Goals    . Patient Stated     Patient stated she would like to continue to loose weight. Has lost 5 lbs.    . Weight (lb) < 200 lb (90.7 kg)     Patient would like to loose weight in the next year at least 10lbs

## 2019-08-23 ENCOUNTER — Other Ambulatory Visit: Payer: Self-pay | Admitting: Physician Assistant

## 2019-08-23 DIAGNOSIS — E7800 Pure hypercholesterolemia, unspecified: Secondary | ICD-10-CM

## 2019-09-17 ENCOUNTER — Other Ambulatory Visit: Payer: Self-pay | Admitting: Physician Assistant

## 2019-09-17 DIAGNOSIS — E7800 Pure hypercholesterolemia, unspecified: Secondary | ICD-10-CM

## 2019-11-03 ENCOUNTER — Encounter: Payer: Self-pay | Admitting: Physician Assistant

## 2019-11-03 ENCOUNTER — Other Ambulatory Visit: Payer: Self-pay

## 2019-11-03 ENCOUNTER — Ambulatory Visit (INDEPENDENT_AMBULATORY_CARE_PROVIDER_SITE_OTHER): Payer: Medicare Other | Admitting: Physician Assistant

## 2019-11-03 VITALS — BP 134/72 | HR 92 | Ht 64.5 in | Wt 176.0 lb

## 2019-11-03 DIAGNOSIS — E782 Mixed hyperlipidemia: Secondary | ICD-10-CM

## 2019-11-03 DIAGNOSIS — M79674 Pain in right toe(s): Secondary | ICD-10-CM | POA: Diagnosis not present

## 2019-11-03 DIAGNOSIS — I10 Essential (primary) hypertension: Secondary | ICD-10-CM | POA: Diagnosis not present

## 2019-11-03 DIAGNOSIS — I700 Atherosclerosis of aorta: Secondary | ICD-10-CM | POA: Diagnosis not present

## 2019-11-03 DIAGNOSIS — R011 Cardiac murmur, unspecified: Secondary | ICD-10-CM | POA: Diagnosis not present

## 2019-11-03 DIAGNOSIS — F17200 Nicotine dependence, unspecified, uncomplicated: Secondary | ICD-10-CM | POA: Diagnosis not present

## 2019-11-03 DIAGNOSIS — R490 Dysphonia: Secondary | ICD-10-CM

## 2019-11-03 MED ORDER — ATORVASTATIN CALCIUM 40 MG PO TABS: ORAL_TABLET | ORAL | 3 refills | Status: DC

## 2019-11-03 NOTE — Progress Notes (Signed)
l °

## 2019-11-03 NOTE — Progress Notes (Signed)
Subjective:    Patient ID: Olivia Frank, female    DOB: 04/25/1951, 69 y.o.   MRN: [id number]  HPI Pt is a 69 yo female with aortic atherosclerosis, HLD, COPD who presents to the clinic for medication refills and check up.   She had her medicare wellness in December.   She is doing ok. She is walking 2-3 miles a day with her dog. She is sleeping well. No CP, palpitations, headaches, or vision changes.   She continues to smoker daily. She has noticed her voice is more hoarse. No reflux or pain. No problems swallowing.   She also is having some right toe pain but admits to falling walking the dog for past 2 days. Toe pain is last 2 days.   .. Active Ambulatory Problems    Diagnosis Date Noted  . Melanoma of skin ([place]) 04/20/2009  . SINUSITIS 09/22/2009  . Postmenopausal 05/18/2009  . CHRONIC OBSTRUCTIVE PULMONARY DISEASE, ACUTE 10/04/2010  . Low back pain 04/14/2012  . Fracture of radial head, right, closed 10/08/2013  . Left wrist sprain 10/08/2013  . Primary osteoarthritis of both knees 11/29/2014  . Stress at home 05/17/2015  . Cerumen impaction 05/17/2015  . Hyperlipidemia 06/06/2015  . Hypertriglyceridemia 06/06/2015  . Cervical spondylosis with radiculopathy 11/06/2015  . Basal cell carcinoma of left lower leg 03/22/2016  . Hyperplastic colon polyp 12/23/2016  . Essential hypertension 12/23/2016  . Pulmonary nodule, right 12/26/2016  . Emphysema lung ([place]) 12/26/2016  . Coronary atherosclerosis 12/26/2016  . Aortic atherosclerosis ([place]) 12/26/2016  . Tobacco dependence 11/03/2019  . Hoarse voice quality 11/03/2019  . Toe pain, right 11/03/2019  . Current smoker 11/03/2019  . Newly recognized heart murmur 11/03/2019   Resolved Ambulatory Problems    Diagnosis Date Noted  . HYPERLIPIDEMIA 05/18/2009  . EUSTACHIAN TUBE DYSFUNCTION, RIGHT 04/20/2009  . MAMMOGRAM, ABNORMAL 07/18/2009  . Pneumonia, organism unspecified([place]) 09/20/2010  . [place] 05/27/2011  .  Elevated blood pressure 05/17/2015  . Bilateral impacted cerumen 01/28/2018   Past Medical History:  Diagnosis Date  . COPD (chronic obstructive pulmonary disease) ([place])   . Melanoma ([place])   . Seasonal allergies    .[name] Family History  Problem Relation Age of Onset  . Cancer Mother        breast  . Hyperlipidemia Mother   . Heart disease Father   . Cancer Sister   . Seizures Sister   . Cancer Maternal Grandmother        breast  . Cancer Maternal Grandfather        colon   .[name] Social History   Socioeconomic History  . Marital status: Married    Spouse name: Elberta Fortis  . Number of children: 2  . Years of education: 70  . Highest education level: 12th grade  Occupational History  . 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 Smoking status: Current Every Day Smoker    Packs/day: 1.00    Years: 51.00    Pack years: 51.00    Types: Cigarettes  . Smokeless tobacco: Never Used  Substance and Sexual Activity  . Alcohol use: Yes    Alcohol/week: 1.0 standard drinks    Types: 1 Glasses of wine per week    Comment: a night with different  . Drug use: No  . Sexual activity: Not Currently  Other Topics Concern  . Not on file  Social History Narrative   . Takes care of her husband  who has vascular dementia and son who is on disability so has a lot on her plate on a daily basis but manageing.   Social Determinants of Health   Financial Resource Strain:   . Difficulty of Paying Living Expenses:   Food Insecurity:   . Worried About Charity fundraiser in the Last Year:   . Arboriculturist in the Last Year:   Transportation Needs:   . Film/video editor (Medical):   [name] Lack of Transportation (Non-Medical):   Physical Activity:   . Days of Exercise per Week:   . Minutes of Exercise per Session:   Stress:   . Feeling of Stress :   Social Connections:   . Frequency of Communication with Friends and Family:   . Frequency of Social  Gatherings with Friends and Family:   . Attends Religious Services:   . Active Member of Clubs or Organizations:   . Attends Archivist Meetings:   [name] Marital Status:   Intimate Partner Violence:   . Fear of Current or Ex-Partner:   . Emotionally Abused:   [name] Physically Abused:   . Sexually Abused:       Review of Systems  All other systems reviewed and are negative.      Objective:   Physical Exam Vitals reviewed.  Constitutional:      Appearance: Normal appearance.  HENT:     Head: Normocephalic.     Nose: Nose normal.     Mouth/Throat:     Mouth: Mucous membranes are moist.  Eyes:     Extraocular Movements: Extraocular movements intact.     Conjunctiva/sclera: Conjunctivae normal.  Neck:     Vascular: No carotid bruit.  Cardiovascular:     Rate and Rhythm: Normal rate and regular rhythm.     Pulses: Normal pulses.     Heart sounds: Murmur present.     Comments: 3/6 SEM best heard of aortic space.  Pulmonary:     Effort: Pulmonary effort is normal.     Comments: Coarse breath sounds.  Abdominal:     General: Bowel sounds are normal. There is no distension.     Palpations: Abdomen is soft.  Musculoskeletal:     Right lower leg: No edema.     Left lower leg: No edema.  Lymphadenopathy:     Cervical: No cervical adenopathy.  Neurological:     Mental Status: She is alert and oriented to person, place, and time.  Psychiatric:        Mood and Affect: Mood normal.      .. Depression screen [facility] 2/9 11/03/2019 08/03/2019 07/29/2018 01/28/2018 11/25/2016  Decreased Interest 1 0 0 1 1  Down, Depressed, Hopeless 1 0 0 0 0  PHQ - 2 Score 2 0 0 1 1  Altered sleeping 0 - - - -  Tired, decreased energy 1 - - - -  Change in appetite 0 - - - -  Feeling bad or failure about yourself  1 - - - -  Trouble concentrating 0 - - - -  Moving slowly or fidgety/restless 0 - - - -  Suicidal thoughts 0 - - - -  PHQ-9 Score 4 - - - -  Difficult doing work/chores Somewhat  difficult - - - -   .[name] 6CIT Screen 11/03/2019 08/03/2019 07/29/2018 11/25/2016  What Year? 0 points 0 points 0 points 0 points  What month? 0 points 0 points 0 points 0 points  What  time? 0 points 0 points 0 points 0 points  Count back from 20 0 points 0 points 0 points 0 points  Months in reverse 0 points 0 points 0 points 0 points  Repeat phrase 2 points 2 points 0 points 0 points  Total Score 2 2 0 0         Assessment & Plan:  Marland KitchenMarland KitchenNisreen was seen today for medicare wellness.  Diagnoses and all orders for this visit:  Aortic atherosclerosis ([place]) -     Lipid Panel w/reflex Direct LDL -     CBC  Essential hypertension -     COMPLETE METABOLIC PANEL WITH GFR -     CBC  Tobacco dependence -     CBC -     Ambulatory referral to ENT  Mixed hyperlipidemia -     atorvastatin (LIPITOR) 40 MG tablet; TAKE 1 TABLET BY MOUTH ONCE DAILY. NEEDS LABS.  Hoarse voice quality -     Ambulatory referral to ENT  Current smoker -     Ambulatory referral to ENT  Toe pain, right   Screening are up to date.  covid vaccine info entered.  Fasting labs ordered.    Pt does continue to smoke. She does have yearly lung CT screening. She has noticed her voice becoming more hoarse. Denies new reflux. Will send to ENT to look at epiglottis and larynx.   New heart murmur. Will evaluate with echo. No new swelling or sOb.   Pt will agree to cut back on smoking.   No specific findings from toe. Could have been injured in fall. Ice and buddy tape and rest. If persistent get xray.

## 2019-11-03 NOTE — Patient Instructions (Addendum)
Get echo ENT to evaluate voice changing Heart Murmur A heart murmur is an extra sound that is caused by chaotic blood flow through the valves of the heart. The murmur can be heard as a "hum" or "whoosh" sound when blood flows through the heart. There are two types of heart murmurs:  Innocent (benign) murmurs. Most people with this type of heart murmur do not have a heart problem. Many children have innocent heart murmurs. Your health care provider may suggest some basic tests to find out whether your murmur is an innocent murmur. If an innocent heart murmur is found, there is no need for further tests or treatment and no need to restrict activities or stop playing sports.  Abnormal murmurs. These types of murmurs can occur in children and adults. Abnormal murmurs may be a sign of a more serious heart condition, such as a heart defect present at birth (congenital defect) or heart valve disease. What are the causes?  The heart has four areas called chambers. Valves separate the upper and lower chambers from each other (tricuspid valve and mitral valve) and separate the lower chambers of the heart from pathways that lead away from the heart (aortic valve and pulmonary valve). Normally, the valves open to let blood flow through or out of your heart, and then they shut to keep the blood from flowing backward. This condition is caused by heart valves that are not working properly.  In children, abnormal heart murmurs are typically caused by congenital defects.  In adults, abnormal murmurs are usually caused by heart valve problems from disease, infection, or aging. This condition may also be caused by:  Pregnancy.  Fever.  Overactive thyroid gland.  Anemia.  Exercise.  Rapid growth spurts (in children). What are the signs or symptoms? Innocent murmurs do not cause symptoms, and many people with abnormal murmurs may not have symptoms. If symptoms do develop, they may include:  Shortness of  breath.  Blue coloring of the skin, especially on the fingertips.  Chest pain.  Palpitations, or feeling a fluttering or skipped heartbeat.  Fainting.  Persistent cough.  Getting tired much faster than expected.  Swelling in the abdomen, feet, or ankles. How is this diagnosed? This condition may be diagnosed during a routine physical or other exam. If your health care provider hears a murmur with a stethoscope, he or she will listen for:  Where the murmur is located in your heart.  How long the murmur lasts (duration).  When the murmur is heard during the heartbeat.  How loud the murmur is. This may help the health care provider figure out what is causing the murmur. You may be referred to a heart specialist (cardiologist). You may also have other tests, including:  Electrocardiogram (ECG or EKG). This test measures the electrical activity of your heart.  Echocardiogram. This test uses high frequency sound waves to make pictures of your heart.  MRI or chest X-ray.  Cardiac catheterization. This test looks at blood flow through the arteries around the heart. For children and adults who have an abnormal heart murmur and want to stay active, it is important to:  Complete testing.  Review test results.  Receive recommendations from your health care provider. If heart disease is present, it may not be safe to play or be active. How is this treated? Heart murmurs themselves do not need treatment. In some cases, a heart murmur may go away on its own. If an underlying problem or disease is causing the  murmur, you may need treatment. If treatment is needed, it will depend on the type and severity of the disease or heart problem causing the murmur. Treatment may include:  Medicine.  Surgery.  Dietary and lifestyle changes. Follow these instructions at home:  Talk with your health care provider before participating in sports or other activities that require a lot of effort  and energy (are strenuous).  Learn as much as possible about your condition and any related diseases. Ask your health care provider if you may be at risk for any medical emergencies.  Talk with your health care provider about what symptoms you should look out for.  It is up to you to get your test results. Ask your health care provider, or the department that is doing the test, when your results will be ready.  Keep all follow-up visits as told by your health care provider. This is important. Contact a health care provider if:  You are frequently short of breath.  You feel more tired than usual.  You are having a hard time keeping up with normal activities or fitness routines.  You have swelling in your ankles or feet.  You notice that your heart often beats irregularly.  You develop any new symptoms. Get help right away if:  You have chest pain.  You are having trouble breathing.  You feel light-headed or you pass out.  Your symptoms suddenly get worse. These symptoms may represent a serious problem that is an emergency. Do not wait to see if the symptoms will go away. Get medical help right away. Call your local emergency services (911 in the [place] Do not drive yourself to the hospital. Summary  Normally, the heart valves open to let blood flow through or out of your heart, and then they shut to keep the blood from flowing backward.  A heart murmur is caused by heart valves that are not working properly.  You may need treatment if an underlying problem or disease is causing the heart murmur. Treatment may include medicine, surgery, or dietary and lifestyle changes.  Talk with your health care provider before participating in sports or other activities that require a lot of effort and energy (are strenuous).  Talk with your health care provider about what symptoms you should watch out for. This information is not intended to replace advice given to you by your health care  provider. Make sure you discuss any questions you have with your health care provider. Document Revised: 01/27/2018 Document Reviewed: 01/27/2018 Elsevier Patient Education  [place].

## 2019-11-04 LAB — CBC
HCT: 46 % — ABNORMAL HIGH (ref 35.0–45.0)
Hemoglobin: 15.4 g/dL (ref 11.7–15.5)
MCH: 31 pg (ref 27.0–33.0)
MCHC: 33.5 g/dL (ref 32.0–36.0)
MCV: 92.7 fL (ref 80.0–100.0)
MPV: 9.7 fL (ref 7.5–12.5)
Platelets: 328 10*3/uL (ref 140–400)
RBC: 4.96 10*6/uL (ref 3.80–5.10)
RDW: 12 % (ref 11.0–15.0)
WBC: 10.3 10*3/uL (ref 3.8–10.8)

## 2019-11-04 LAB — COMPLETE METABOLIC PANEL WITH GFR
AG Ratio: 1.8 (calc) (ref 1.0–2.5)
ALT: 38 U/L — ABNORMAL HIGH (ref 6–29)
AST: 28 U/L (ref 10–35)
Albumin: 4.2 g/dL (ref 3.6–5.1)
Alkaline phosphatase (APISO): 88 U/L (ref 37–153)
BUN: 16 mg/dL (ref 7–25)
CO2: 27 mmol/L (ref 20–32)
Calcium: 9.7 mg/dL (ref 8.6–10.4)
Chloride: 106 mmol/L (ref 98–110)
Creat: 0.66 mg/dL (ref 0.50–0.99)
GFR, Est African American: 104 mL/min/{1.73_m2} (ref >=60)
GFR, Est Non African American: 90 mL/min/{1.73_m2} (ref >=60)
Globulin: 2.3 g/dL (calc) (ref 1.9–3.7)
Glucose, Bld: 89 mg/dL (ref 65–99)
Potassium: 4.6 mmol/L (ref 3.5–5.3)
Sodium: 141 mmol/L (ref 135–146)
Total Bilirubin: 0.4 mg/dL (ref 0.2–1.2)
Total Protein: 6.5 g/dL (ref 6.1–8.1)

## 2019-11-04 LAB — LIPID PANEL W/REFLEX DIRECT LDL
Cholesterol: 169 mg/dL (ref <200)
HDL: 60 mg/dL (ref >=50)
LDL Cholesterol (Calc): 75 mg/dL (calc)
Non-HDL Cholesterol (Calc): 109 mg/dL (calc) (ref <130)
Total CHOL/HDL Ratio: 2.8 (calc) (ref <5.0)
Triglycerides: 242 mg/dL — ABNORMAL HIGH (ref <150)

## 2019-11-04 NOTE — Progress Notes (Signed)
[name],   Your triglycerides remain elevated. LDL looks good. My suggestion is to add tricor daily to decrese TG. Are you ok with this?   Kidney and glucose looks great.   ALT one liver enzyme is elevated but we have seen this in the past. How much alcohol are you drinking? Avoid alcohol and tylenol products and recheck in 2 weeks.   Hematocrit up a little due to smoking.

## 2019-11-09 ENCOUNTER — Other Ambulatory Visit: Payer: Self-pay

## 2019-11-09 ENCOUNTER — Ambulatory Visit (HOSPITAL_BASED_OUTPATIENT_CLINIC_OR_DEPARTMENT_OTHER)
Admission: RE | Admit: 2019-11-09 | Discharge: 2019-11-09 | Disposition: A | Discharge status: Home / Self Care | Payer: Medicare Other | Source: Ambulatory Visit | Attending: Physician Assistant | Admitting: Physician Assistant

## 2019-11-09 DIAGNOSIS — R011 Cardiac murmur, unspecified: Secondary | ICD-10-CM | POA: Diagnosis not present

## 2019-11-09 NOTE — Progress Notes (Signed)
  Echocardiogram 2D Echocardiogram has been performed.  Cardell Peach 11/09/2019, 1:30 PM

## 2019-11-10 ENCOUNTER — Encounter: Payer: Self-pay | Admitting: Physician Assistant

## 2019-11-11 DIAGNOSIS — J383 Other diseases of vocal cords: Secondary | ICD-10-CM | POA: Diagnosis not present

## 2019-11-11 NOTE — Telephone Encounter (Signed)
Forwarding to provider.

## 2019-11-12 ENCOUNTER — Encounter: Payer: Self-pay | Admitting: Physician Assistant

## 2019-11-12 NOTE — Progress Notes (Signed)
Thanks so much. 

## 2019-11-12 NOTE — Progress Notes (Signed)
The turbulent flow across LVOT-she is on statin does cardiology need to see this. EchO due to new murmur no symptoms.

## 2019-11-12 NOTE — Telephone Encounter (Signed)
Routing to provider  

## 2019-11-12 NOTE — Progress Notes (Signed)
Olivia Frank,   I am not sure why you can not see your echo full report. I released it so maybe recheck after you get this message.   Good ejection fraction. You have some slight impaired relaxation but nothing to be concerned with.  Mitral/aortic valve looks good.  You do have some turbulant flow over left ventricular output-likely murmur I heard. You are on statin which is great news. I will send to another provider to review but there is not special monitoring that comes with this.   [name]

## 2019-11-25 DIAGNOSIS — H25813 Combined forms of age-related cataract, bilateral: Secondary | ICD-10-CM | POA: Diagnosis not present

## 2019-11-25 DIAGNOSIS — H524 Presbyopia: Secondary | ICD-10-CM | POA: Diagnosis not present

## 2019-11-25 DIAGNOSIS — H35343 Macular cyst, hole, or pseudohole, bilateral: Secondary | ICD-10-CM | POA: Diagnosis not present

## 2020-01-31 ENCOUNTER — Encounter: Payer: Self-pay | Admitting: Medical-Surgical

## 2020-01-31 ENCOUNTER — Ambulatory Visit (INDEPENDENT_AMBULATORY_CARE_PROVIDER_SITE_OTHER): Payer: Medicare Other | Admitting: Medical-Surgical

## 2020-01-31 ENCOUNTER — Other Ambulatory Visit: Payer: Self-pay

## 2020-01-31 VITALS — BP 136/84 | HR 74 | Temp 98.2°F | Ht 65.0 in | Wt 178.0 lb

## 2020-01-31 DIAGNOSIS — H6123 Impacted cerumen, bilateral: Secondary | ICD-10-CM | POA: Diagnosis not present

## 2020-01-31 NOTE — Patient Instructions (Addendum)
use a cotton ball dipped in mineral oil and place in the external canal for 10 to 20 minutes once per week (combined with eight hours of not using a hearing aid overnight, if applicable). This helps to liquefy the cerumen and aid the normal elimination mechanisms, thereby potentially reducing the number of visits per year for cerumen removal.   Earwax Buildup, Adult The ears produce a substance called earwax that helps keep bacteria out of the ear and protects the skin in the ear canal. Occasionally, earwax can build up in the ear and cause discomfort or hearing loss. What increases the risk? This condition is more likely to develop in people who:  Are female.  Are elderly.  Naturally produce more earwax.  Clean their ears often with cotton swabs.  Use earplugs often.  Use in-ear headphones often.  Wear hearing aids.  Have narrow ear canals.  Have earwax that is overly thick or sticky.  Have eczema.  Are dehydrated.  Have excess hair in the ear canal. What are the signs or symptoms? Symptoms of this condition include:  Reduced or muffled hearing.  A feeling of fullness in the ear or feeling that the ear is plugged.  Fluid coming from the ear.  Ear pain.  Ear itch.  Ringing in the ear.  Coughing.  An obvious piece of earwax that can be seen inside the ear canal. How is this diagnosed? This condition may be diagnosed based on:  Your symptoms.  Your medical history.  An ear exam. During the exam, your health care provider will look into your ear with an instrument called an otoscope. You may have tests, including a hearing test. How is this treated? This condition may be treated by:  Using ear drops to soften the earwax.  Having the earwax removed by a health care provider. The health care provider may: ? Flush the ear with water. ? Use an instrument that has a loop on the end (curette). ? Use a suction device.  Surgery to remove the wax buildup. This  may be done in severe cases. Follow these instructions at home:   Take over-the-counter and prescription medicines only as told by your health care provider.  Do not put any objects, including cotton swabs, into your ear. You can clean the opening of your ear canal with a washcloth or facial tissue.  Follow instructions from your health care provider about cleaning your ears. Do not over-clean your ears.  Drink enough fluid to keep your urine clear or pale yellow. This will help to thin the earwax.  Keep all follow-up visits as told by your health care provider. If earwax builds up in your ears often or if you use hearing aids, consider seeing your health care provider for routine, preventive ear cleanings. Ask your health care provider how often you should schedule your cleanings.  If you have hearing aids, clean them according to instructions from the manufacturer and your health care provider. Contact a health care provider if:  You have ear pain.  You develop a fever.  You have blood, pus, or other fluid coming from your ear.  You have hearing loss.  You have ringing in your ears that does not go away.  Your symptoms do not improve with treatment.  You feel like the room is spinning (vertigo). Summary  Earwax can build up in the ear and cause discomfort or hearing loss.  The most common symptoms of this condition include reduced or muffled hearing and  a feeling of fullness in the ear or feeling that the ear is plugged.  This condition may be diagnosed based on your symptoms, your medical history, and an ear exam.  This condition may be treated by using ear drops to soften the earwax or by having the earwax removed by a health care provider.  Do not put any objects, including cotton swabs, into your ear. You can clean the opening of your ear canal with a washcloth or facial tissue. This information is not intended to replace advice given to you by your health care  provider. Make sure you discuss any questions you have with your health care provider. Document Revised: 07/18/2017 Document Reviewed: 10/16/2016 Elsevier Patient Education  2020 [employer].

## 2020-01-31 NOTE — Progress Notes (Signed)
Subjective:    CC: right ear hearing loss and wax buildup  HPI: Very pleasant 69 year old female presenting today with reports of right ear hearing loss.  She reports a long history of producing copious amounts of earwax.  She has used Debrox and peroxide with no improvement.  She does not wear hearing aids.  Denies ear pain, ringing, headaches, and dizziness.  Reports her ear feels "stopped up".  Feels that after her shower, water tends to linger in her ears and she has difficulty getting it out.  Uses Q-tips to clean the groups around the pinna but does not insert them into her ear canals.  I reviewed the past medical history, family history, social history, surgical history, and allergies today and no changes were needed.  Please see the problem list section below in epic for further details.  Past Medical History: Past Medical History:  Diagnosis Date  . COPD (chronic obstructive pulmonary disease) ([place])   . Melanoma ([place])   . Seasonal allergies    Past Surgical History: Past Surgical History:  Procedure Laterality Date  . ABDOMINAL HYSTERECTOMY    . APPENDECTOMY    . OVARIAN CYST REMOVAL     Social History: Social History   Socioeconomic History  . Marital status: Married    Spouse name: Elberta Fortis  . Number of children: 2  . Years of education: 29  . Highest education level: 12th grade  Occupational History  . 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 Smoking status: Current Every Day Smoker    Packs/day: 1.00    Years: 51.00    Pack years: 51.00    Types: Cigarettes  . Smokeless tobacco: Never Used  Vaping Use  . Vaping Use: Never used  Substance and Sexual Activity  . Alcohol use: Yes    Alcohol/week: 1.0 standard drink    Types: 1 Glasses of wine per week    Comment: a night with different  . Drug use: No  . Sexual activity: Not Currently  Other Topics Concern  . Not on file  Social History Narrative   . Takes  care of her husband who has vascular dementia and son who is on disability so has a lot on her plate on a daily basis but manageing.   Social Determinants of Health   Financial Resource Strain:   . Difficulty of Paying Living Expenses:   Food Insecurity:   . Worried About Charity fundraiser in the Last Year:   . Arboriculturist in the Last Year:   Transportation Needs:   . Film/video editor (Medical):   [name] Lack of Transportation (Non-Medical):   Physical Activity:   . Days of Exercise per Week:   . Minutes of Exercise per Session:   Stress:   . Feeling of Stress :   Social Connections:   . Frequency of Communication with Friends and Family:   . Frequency of Social Gatherings with Friends and Family:   . Attends Religious Services:   . Active Member of Clubs or Organizations:   . Attends Archivist Meetings:   [name] Marital Status:    Family History: Family History  Problem Relation Age of Onset  . Cancer Mother        breast  . Hyperlipidemia Mother   . Heart disease Father   . Cancer Sister   . Seizures Sister   . Cancer Maternal Grandmother  breast  . Cancer Maternal Grandfather        colon   Allergies: Allergies  Allergen Reactions  . Morphine And Related Hives and Itching  . Ace Inhibitors     cough   Medications: See med rec.  Review of Systems: No fevers, chills, night sweats, weight loss, chest pain, or shortness of breath.   Objective:    General: Well Developed, well nourished, and in no acute distress.  Neuro: Alert and oriented x3.  HEENT: Normocephalic, atraumatic.  Right external ear impacted with dark yellow/brown cerumen, unable to visualize TM.  Moderate amount of wax removed with irrigation from the right ear but unable to clear the ear completely.  left external ear canal with large amount of dark yellow/brown cerumen, TM partially visible. Skin: Warm and dry. Cardiac: Regular rate and rhythm, no murmurs rubs or gallops, no  lower extremity edema.  Respiratory: Clear to auscultation bilaterally. Not using accessory muscles, speaking in full sentences.  Impression and Recommendations:    1. Bilateral impacted cerumen Right ear irrigation completed in office.  After irrigation, patient reports that she can hear a little better out of that ear.  Advised patient to try using cotton balls soaked in mineral oil placed in bilateral ears for 10 to 20 minutes once weekly.  Allow water to run into the ears and flush them while showering.  May continue to use Debrox as needed.  If these interventions are unsuccessful, may need to return to ENT for further evaluation and treatment plan  Return if symptoms worsen or fail to improve. ___________________________________________ Clearnce Sorrel, DNP, APRN, FNP-BC [facility] [place]

## 2020-02-02 DIAGNOSIS — H40013 Open angle with borderline findings, low risk, bilateral: Secondary | ICD-10-CM | POA: Diagnosis not present

## 2020-02-04 DIAGNOSIS — H6123 Impacted cerumen, bilateral: Secondary | ICD-10-CM | POA: Diagnosis not present

## 2020-02-22 ENCOUNTER — Ambulatory Visit (INDEPENDENT_AMBULATORY_CARE_PROVIDER_SITE_OTHER): Payer: Medicare Other

## 2020-02-22 ENCOUNTER — Other Ambulatory Visit: Payer: Self-pay

## 2020-02-22 DIAGNOSIS — Z122 Encounter for screening for malignant neoplasm of respiratory organs: Secondary | ICD-10-CM

## 2020-02-22 DIAGNOSIS — F17200 Nicotine dependence, unspecified, uncomplicated: Secondary | ICD-10-CM | POA: Diagnosis not present

## 2020-02-22 DIAGNOSIS — Z87891 Personal history of nicotine dependence: Secondary | ICD-10-CM | POA: Diagnosis not present

## 2020-02-22 DIAGNOSIS — F17210 Nicotine dependence, cigarettes, uncomplicated: Secondary | ICD-10-CM

## 2020-02-24 NOTE — Progress Notes (Signed)
Please call patient and let them  know their  low dose Ct was read as a Lung RADS 2: nodules that are benign in appearance and behavior with a very low likelihood of becoming a clinically active cancer due to size or lack of growth. Recommendation per radiology is for a repeat LDCT in 12 months. .Please let them  know we will order and schedule their  annual screening scan for 02/2021. Please let them  know there was notation of CAD on their  scan.  Please remind the patient  that this is a non-gated exam therefore degree or severity of disease  cannot be determined. Please have them  follow up with their PCP regarding potential risk factor modification, dietary therapy or pharmacologic therapy if clinically indicated. Pt.  is  currently on statin therapy. Please place order for annual  screening scan for  02/2021 and fax results to PCP. Thanks so much. 

## 2020-02-25 ENCOUNTER — Other Ambulatory Visit: Payer: Self-pay | Admitting: *Deleted

## 2020-02-25 DIAGNOSIS — Z87891 Personal history of nicotine dependence: Secondary | ICD-10-CM

## 2020-02-25 DIAGNOSIS — F17210 Nicotine dependence, cigarettes, uncomplicated: Secondary | ICD-10-CM

## 2020-03-01 NOTE — Progress Notes (Signed)
CT does show emphysema. If shortness of breath or cough is keeping you from doing things please reach out.

## 2020-03-02 NOTE — Progress Notes (Signed)
Spoke with patient about results. She has been informed about results from pulmonology. She states she has never had SOB, but if anything changes she will let us know.

## 2020-03-14 ENCOUNTER — Other Ambulatory Visit: Payer: Self-pay | Admitting: Physician Assistant

## 2020-03-14 DIAGNOSIS — Z1231 Encounter for screening mammogram for malignant neoplasm of breast: Secondary | ICD-10-CM

## 2020-03-22 ENCOUNTER — Ambulatory Visit (INDEPENDENT_AMBULATORY_CARE_PROVIDER_SITE_OTHER): Payer: Medicare Other

## 2020-03-22 ENCOUNTER — Other Ambulatory Visit: Payer: Self-pay

## 2020-03-22 DIAGNOSIS — Z1231 Encounter for screening mammogram for malignant neoplasm of breast: Secondary | ICD-10-CM

## 2020-03-26 NOTE — Progress Notes (Signed)
Olivia Frank,   Normal mammogram follow up in 1 year.

## 2020-04-12 ENCOUNTER — Encounter: Payer: Self-pay | Admitting: Physician Assistant

## 2020-04-17 ENCOUNTER — Encounter: Payer: Self-pay | Admitting: Physician Assistant

## 2020-04-17 ENCOUNTER — Telehealth (INDEPENDENT_AMBULATORY_CARE_PROVIDER_SITE_OTHER): Payer: Medicare Other | Admitting: Physician Assistant

## 2020-04-17 VITALS — BP 115/65 | Temp 99.0°F | Ht 65.0 in | Wt 174.0 lb

## 2020-04-17 DIAGNOSIS — J441 Chronic obstructive pulmonary disease with (acute) exacerbation: Secondary | ICD-10-CM | POA: Diagnosis not present

## 2020-04-17 DIAGNOSIS — J432 Centrilobular emphysema: Secondary | ICD-10-CM

## 2020-04-17 DIAGNOSIS — F17200 Nicotine dependence, unspecified, uncomplicated: Secondary | ICD-10-CM

## 2020-04-17 MED ORDER — AZITHROMYCIN 250 MG PO TABS: ORAL_TABLET | ORAL | 0 refills | Status: DC

## 2020-04-17 MED ORDER — ALBUTEROL SULFATE HFA 108 (90 BASE) MCG/ACT IN AERS: 2.0000 | INHALATION_SPRAY | RESPIRATORY_TRACT | 1 refills | Status: DC | PRN

## 2020-04-17 MED ORDER — PREDNISONE 20 MG PO TABS: ORAL_TABLET | ORAL | 0 refills | Status: DC

## 2020-04-17 MED ORDER — HYDROCOD POLST-CPM POLST ER 10-8 MG/5ML PO SUER: 5.0000 mL | Freq: Two times a day (BID) | ORAL | 0 refills | Status: DC | PRN

## 2020-04-17 NOTE — Progress Notes (Signed)
Symptoms started last Wednesday Fever - highest 103.9 (taking ibuprofen/aleve)  Chills Fatigue Muscle aches Cough Sweats Urine - dark (no pain with urination/frequency/other urinary symptoms)  No headache No loss of taste/smell  Had two Covid tests (rapid and PCR) both negative  Will check blood pressure and give to [place] at visit

## 2020-04-17 NOTE — Progress Notes (Signed)
Patient ID: Olivia Frank, female   DOB: [birth year], 69 y.o.   MRN: [id number] .Marland KitchenVirtual Visit via Video Note  I connected with Jolena Kittle on 74/08/14 at  2:40 PM EDT by a video enabled telemedicine application and verified that I am speaking with the correct person using two identifiers.  Location: Patient: home Provider: clinic   I discussed the limitations of evaluation and management by telemedicine and the availability of in person appointments. The patient expressed understanding and agreed to proceed.  History of Present Illness: Patient is a 69 year old female with emphysema and COPD who continues to smoke presents to the clinic with 6 days of shortness of breath, cough, wheezing, chest tightness, headache, fever the highest 23.9, sweats, dark urine with no dysuria or increase in frequency.  Patient is very easily winded and not able to ambulate very much.  She has been tested for Covid rapid and PCR both were negative.  She did have the Covid vaccine.  She denies any loss of smell or taste.  She is taking over-the-counter Delsym and Mucinex with little relief.  She is taking Tylenol and ibuprofen  for her fever which helps some  .[name] Active Ambulatory Problems    Diagnosis Date Noted  . Melanoma of skin ([place]) 04/20/2009  . SINUSITIS 09/22/2009  . Postmenopausal 05/18/2009  . CHRONIC OBSTRUCTIVE PULMONARY DISEASE, ACUTE 10/04/2010  . Low back pain 04/14/2012  . Fracture of radial head, right, closed 10/08/2013  . Left wrist sprain 10/08/2013  . Primary osteoarthritis of both knees 11/29/2014  . Stress at home 05/17/2015  . Cerumen impaction 05/17/2015  . Hyperlipidemia 06/06/2015  . Hypertriglyceridemia 06/06/2015  . Cervical spondylosis with radiculopathy 11/06/2015  . Basal cell carcinoma of left lower leg 03/22/2016  . Hyperplastic colon polyp 12/23/2016  . Essential hypertension 12/23/2016  . Pulmonary nodule, right 12/26/2016  . Emphysema lung ([place]) 12/26/2016  .  Coronary atherosclerosis 12/26/2016  . Aortic atherosclerosis ([place]) 12/26/2016  . Tobacco dependence 11/03/2019  . Hoarse voice quality 11/03/2019  . Toe pain, right 11/03/2019  . Current smoker 11/03/2019  . Newly recognized heart murmur 11/03/2019   Resolved Ambulatory Problems    Diagnosis Date Noted  . HYPERLIPIDEMIA 05/18/2009  . EUSTACHIAN TUBE DYSFUNCTION, RIGHT 04/20/2009  . MAMMOGRAM, ABNORMAL 07/18/2009  . Pneumonia, organism unspecified([place]) 09/20/2010  . [place] 05/27/2011  . Elevated blood pressure 05/17/2015  . Bilateral impacted cerumen 01/28/2018   Past Medical History:  Diagnosis Date  . COPD (chronic obstructive pulmonary disease) ([place])   . Melanoma ([place])   . Seasonal allergies    Reviewed med, allergy, problem list.   Observations/Objective: Appears fatigued and pale She is slightly labored in breathing No wheezing Productive cough  .[name] Today's Vitals   04/17/20 1309 04/17/20 1448  BP:  115/65  Temp: 99 F (37.2 C)   TempSrc: Oral   Weight: 174 lb (78.9 kg)   Height: 5\' 5"  (1.651 m)    Body mass index is 28.96 kg/m.     Assessment and Plan: Marland KitchenMarland KitchenMa was seen today for fever.  Diagnoses and all orders for this visit:  COPD exacerbation ([place]) -     chlorpheniramine-HYDROcodone ([place]) 10-8 MG/5ML SUER; Take 5 mLs by mouth every 12 (twelve) hours as needed. -     albuterol (VENTOLIN HFA) 108 (90 Base) MCG/ACT inhaler; Inhale 2 puffs into the lungs every 4 (four) hours as needed for wheezing or shortness of breath. -     azithromycin (ZITHROMAX Z-PAK) 250 MG tablet; Take 2  tablets (500 mg) on  Day 1,  followed by 1 tablet (250 mg) once daily on Days 2 through 5. -     predniSONE (DELTASONE) 20 MG tablet; Take 3 tablets for 3 days, take 2 tablets for 3 days, take 2 tablets for 3 days, take 1/2 tablet for 4 days.  Centrilobular emphysema ([facility])  Tobacco dependence   Discussed with patient to negative Covid test therefore likely COPD  exacerbation from some other viral infection.  It is concerning that her fever continues to spike if she is not on Tylenol or ibuprofen.  Could be COPD exacerbation is moving into pneumonia.  Will treat with azithromycin, prednisone taper, albuterol, cough syrup as needed.  Patient is aware that cough syrup may make sleepy and only use when she can lay down.  She has had this before and tolerated well.  Although she does not want to stop smoking she has not smoked in the last 5 days.  Patient feels like she has a pulse oximetry around the house and will look for.  When she was at urgent care over the weekend her pulse ox was 97% per patient.  Strongly urged follow-up or to reach out if symptoms worsening or not improving.   Follow Up Instructions:    I discussed the assessment and treatment plan with the patient. The patient was provided an opportunity to ask questions and all were answered. The patient agreed with the plan and demonstrated an understanding of the instructions.   The patient was advised to call back or seek an in-person evaluation if the symptoms worsen or if the condition fails to improve as anticipated.  I provided 13 minutes of non-face-to-face time during this encounter.   [name], PA-C

## 2020-04-18 ENCOUNTER — Encounter: Payer: Self-pay | Admitting: Physician Assistant

## 2020-04-20 ENCOUNTER — Telehealth: Payer: Self-pay

## 2020-04-20 MED ORDER — PROMETHAZINE-DM 6.25-15 MG/5ML PO SYRP: 5.0000 mL | ORAL_SOLUTION | Freq: Four times a day (QID) | ORAL | 0 refills | Status: DC | PRN

## 2020-04-20 NOTE — Telephone Encounter (Signed)
Olivia Frank advised of cough syrup.

## 2020-04-20 NOTE — Telephone Encounter (Signed)
The pharmacy called and left a message that they are out of Tussionex. They are requesting a different cough medication. Pended one they do have.

## 2020-04-20 NOTE — Telephone Encounter (Signed)
Ok sent!

## 2020-05-04 DIAGNOSIS — L249 Irritant contact dermatitis, unspecified cause: Secondary | ICD-10-CM | POA: Diagnosis not present

## 2020-05-04 DIAGNOSIS — Z85828 Personal history of other malignant neoplasm of skin: Secondary | ICD-10-CM | POA: Diagnosis not present

## 2020-05-04 DIAGNOSIS — L819 Disorder of pigmentation, unspecified: Secondary | ICD-10-CM | POA: Diagnosis not present

## 2020-05-04 DIAGNOSIS — L814 Other melanin hyperpigmentation: Secondary | ICD-10-CM | POA: Diagnosis not present

## 2020-05-04 DIAGNOSIS — L905 Scar conditions and fibrosis of skin: Secondary | ICD-10-CM | POA: Diagnosis not present

## 2020-05-04 DIAGNOSIS — D1801 Hemangioma of skin and subcutaneous tissue: Secondary | ICD-10-CM | POA: Diagnosis not present

## 2020-05-04 DIAGNOSIS — Z85820 Personal history of malignant melanoma of skin: Secondary | ICD-10-CM | POA: Diagnosis not present

## 2020-06-01 ENCOUNTER — Ambulatory Visit (INDEPENDENT_AMBULATORY_CARE_PROVIDER_SITE_OTHER): Payer: Medicare Other | Admitting: Osteopathic Medicine

## 2020-06-01 ENCOUNTER — Other Ambulatory Visit: Payer: Self-pay

## 2020-06-01 DIAGNOSIS — Z23 Encounter for immunization: Secondary | ICD-10-CM | POA: Diagnosis not present

## 2020-09-18 DIAGNOSIS — H35343 Macular cyst, hole, or pseudohole, bilateral: Secondary | ICD-10-CM | POA: Diagnosis not present

## 2020-11-03 ENCOUNTER — Other Ambulatory Visit: Payer: Self-pay

## 2020-11-03 ENCOUNTER — Ambulatory Visit (INDEPENDENT_AMBULATORY_CARE_PROVIDER_SITE_OTHER): Payer: Medicare Other | Admitting: Physician Assistant

## 2020-11-03 ENCOUNTER — Encounter: Payer: Self-pay | Admitting: Physician Assistant

## 2020-11-03 VITALS — BP 152/76 | HR 87 | Ht 65.0 in | Wt 176.0 lb

## 2020-11-03 DIAGNOSIS — Z0000 Encounter for general adult medical examination without abnormal findings: Secondary | ICD-10-CM | POA: Diagnosis not present

## 2020-11-03 DIAGNOSIS — J432 Centrilobular emphysema: Secondary | ICD-10-CM | POA: Diagnosis not present

## 2020-11-03 DIAGNOSIS — E782 Mixed hyperlipidemia: Secondary | ICD-10-CM | POA: Diagnosis not present

## 2020-11-03 DIAGNOSIS — I10 Essential (primary) hypertension: Secondary | ICD-10-CM | POA: Diagnosis not present

## 2020-11-03 DIAGNOSIS — Z79899 Other long term (current) drug therapy: Secondary | ICD-10-CM | POA: Diagnosis not present

## 2020-11-03 DIAGNOSIS — Z1329 Encounter for screening for other suspected endocrine disorder: Secondary | ICD-10-CM

## 2020-11-03 DIAGNOSIS — R030 Elevated blood-pressure reading, without diagnosis of hypertension: Secondary | ICD-10-CM

## 2020-11-03 DIAGNOSIS — F17200 Nicotine dependence, unspecified, uncomplicated: Secondary | ICD-10-CM | POA: Diagnosis not present

## 2020-11-03 DIAGNOSIS — I700 Atherosclerosis of aorta: Secondary | ICD-10-CM

## 2020-11-03 DIAGNOSIS — E781 Pure hyperglyceridemia: Secondary | ICD-10-CM | POA: Diagnosis not present

## 2020-11-03 NOTE — Progress Notes (Signed)
Subjective:     Olivia Frank is a 70 y.o. female and is here for a comprehensive physical exam. The patient reports problems - she has noticed some hair loss, diffusely for last month or so.    Pt continues to smoke. She denies any SOB. She does have chronic cough. Walks 1-3 miles a day.   Checking BP at home and 130s over 70s.   Social History   Socioeconomic History  . Marital status: Married    Spouse name: Elberta Fortis  . Number of children: 2  . Years of education: 19  . Highest education level: 12th grade  Occupational History  . 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 Smoking status: Current Every Day Smoker    Packs/day: 1.00    Years: 51.00    Pack years: 51.00    Types: Cigarettes  . Smokeless tobacco: Never Used  Vaping Use  . Vaping Use: Never used  Substance and Sexual Activity  . Alcohol use: Yes    Alcohol/week: 1.0 standard drink    Types: 1 Glasses of wine per week    Comment: a night with different  . Drug use: No  . Sexual activity: Not Currently  Other Topics Concern  . Not on file  Social History Narrative   . Takes care of her husband who has vascular dementia and son who is on disability so has a lot on her plate on a daily basis but manageing.   Social Determinants of Health   Financial Resource Strain: Not on file  Food Insecurity: Not on file  Transportation Needs: Not on file  Physical Activity: Not on file  Stress: Not on file  Social Connections: Not on file  Intimate Partner Violence: Not on file   Health Maintenance  Topic Date Due  . COVID-19 Vaccine (4 - Booster for Pfizer series) 11/14/2020  . MAMMOGRAM  03/22/2022  . COLONOSCOPY (Pts 45-75yrs Insurance coverage will need to be confirmed)  12/20/2026  . TETANUS/TDAP  03/19/2029  . INFLUENZA VACCINE  Completed  . DEXA SCAN  Completed  . Hepatitis C Screening  Completed  . PNA vac Low Risk Adult  Completed  . HPV VACCINES  Aged Out     The following portions of the patient's history were reviewed and updated as appropriate: allergies, current medications, past family history, past medical history, past social history, past surgical history and problem list.  Review of Systems Pertinent items noted in HPI and remainder of comprehensive ROS otherwise negative.   Objective:    BP (!) 152/76   Pulse 87   Ht 5\' 5"  (1.651 m)   Wt 176 lb (79.8 kg)   SpO2 95%   BMI 29.29 kg/m  General appearance: alert, cooperative and appears stated age Head: Normocephalic, without obvious abnormality, atraumatic Eyes: conjunctivae/corneas clear. PERRL, EOM's intact. Fundi benign. Ears: normal TM's and external ear canals both ears Nose: Nares normal. Septum midline. Mucosa normal. No drainage or sinus tenderness. Throat: lips, mucosa, and tongue normal; teeth and gums normal Neck: no adenopathy, no carotid bruit, no JVD, supple, symmetrical, trachea midline and thyroid not enlarged, symmetric, no tenderness/mass/nodules Back: symmetric, no curvature. ROM normal. No CVA tenderness. Lungs: clear to auscultation bilaterally Heart: regular rate and rhythm and systolic murmur: systolic ejection 2/6, decrescendo at 2nd left intercostal space, at 2nd right intercostal space Abdomen: soft, non-tender; bowel sounds normal; no masses,  no organomegaly Extremities: extremities normal, atraumatic, no cyanosis  or edema Pulses: 2+ and symmetric Skin: Skin color, texture, turgor normal. No rashes or lesions Lymph nodes: Cervical, supraclavicular, and axillary nodes normal. Neurologic: Alert and oriented X 3, normal strength and tone. Normal symmetric reflexes. Normal coordination and gait   .[name] Depression screen [facility] 2/9 11/03/2020 11/03/2019 08/03/2019 07/29/2018 01/28/2018  Decreased Interest 0 1 0 0 1  Down, Depressed, Hopeless 1 1 0 0 0  PHQ - 2 Score 1 2 0 0 1  Altered sleeping 0 0 - - -  Tired, decreased energy 1 1 - - -  Change in appetite  0 0 - - -  Feeling bad or failure about yourself  1 1 - - -  Trouble concentrating 0 0 - - -  Moving slowly or fidgety/restless 0 0 - - -  Suicidal thoughts 0 0 - - -  PHQ-9 Score 3 4 - - -  Difficult doing work/chores Somewhat difficult Somewhat difficult - - -   .[name] GAD 7 : Generalized Anxiety Score 11/03/2020 11/03/2019  Nervous, Anxious, on Edge 1 1  Control/stop worrying 0 0  Worry too much - different things 1 1  Trouble relaxing 1 0  Restless 1 0  Easily annoyed or irritable 1 1  Afraid - awful might happen 1 0  Total GAD 7 Score 6 3  Anxiety Difficulty Somewhat difficult Somewhat difficult     Assessment:    Healthy female exam.      Plan:    Marland KitchenMarland KitchenRorie was seen today for annual exam.  Diagnoses and all orders for this visit:  Routine physical examination -     CBC with Differential/Platelet -     COMPLETE METABOLIC PANEL WITH GFR -     Lipid Panel w/reflex Direct LDL -     TSH  Mixed hyperlipidemia -     Lipid Panel w/reflex Direct LDL  Essential hypertension -     COMPLETE METABOLIC PANEL WITH GFR  Thyroid disorder screen -     TSH  Medication management -     CBC with Differential/Platelet -     COMPLETE METABOLIC PANEL WITH GFR -     Lipid Panel w/reflex Direct LDL -     TSH  Aortic atherosclerosis (HCC)  Centrilobular emphysema (HCC)  Tobacco dependence  Hypertriglyceridemia  Elevated BP without diagnosis of hypertension   .[name] Discussed 150 minutes of exercise a week.  Encouraged vitamin D 1000 units and Calcium 1300mg  or 4 servings of dairy a day.  Fasting labs ordered.  Mammogram UTD.  No need for pap.  dexa UTD.  Colonoscopy UTD. Encouraged shingles shot at pharmacy.  UTD covid/pneumonia.  PHQ/GAD stable.    BP elevated in office. Per patient 130's over 70's at home. Continue to monitor. Discussed risk of HTN.   Pt refuses smoking cessation. Scheduled yearly CT low dose lung cancer screening.   Discussed hair loss. Ordered TSH.  Start biotin. Eat protein. Likely due to stress. No signs of patchy hair loss.   Encouraged yearly dental, eye, dermatology exam.    See After Visit Summary for Counseling Recommendations

## 2020-11-03 NOTE — Patient Instructions (Signed)
Health Maintenance After Age 70 After age 70, you are at a higher risk for certain long-term diseases and infections as well as injuries from falls. Falls are a major cause of broken bones and head injuries in people who are older than age 70. Getting regular preventive care can help to keep you healthy and well. Preventive care includes getting regular testing and making lifestyle changes as recommended by your health care provider. Talk with your health care provider about:  Which screenings and tests you should have. A screening is a test that checks for a disease when you have no symptoms.  A diet and exercise plan that is right for you. What should I know about screenings and tests to prevent falls? Screening and testing are the best ways to find a health problem early. Early diagnosis and treatment give you the best chance of managing medical conditions that are common after age 70. Certain conditions and lifestyle choices may make you more likely to have a fall. Your health care provider may recommend:  Regular vision checks. Poor vision and conditions such as cataracts can make you more likely to have a fall. If you wear glasses, make sure to get your prescription updated if your vision changes.  Medicine review. Work with your health care provider to regularly review all of the medicines you are taking, including over-the-counter medicines. Ask your health care provider about any side effects that may make you more likely to have a fall. Tell your health care provider if any medicines that you take make you feel dizzy or sleepy.  Osteoporosis screening. Osteoporosis is a condition that causes the bones to get weaker. This can make the bones weak and cause them to break more easily.  Blood pressure screening. Blood pressure changes and medicines to control blood pressure can make you feel dizzy.  Strength and balance checks. Your health care provider may recommend certain tests to check your  strength and balance while standing, walking, or changing positions.  Foot health exam. Foot pain and numbness, as well as not wearing proper footwear, can make you more likely to have a fall.  Depression screening. You may be more likely to have a fall if you have a fear of falling, feel emotionally low, or feel unable to do activities that you used to do.  Alcohol use screening. Using too much alcohol can affect your balance and may make you more likely to have a fall. What actions can I take to lower my risk of falls? General instructions  Talk with your health care provider about your risks for falling. Tell your health care provider if: ? You fall. Be sure to tell your health care provider about all falls, even ones that seem minor. ? You feel dizzy, sleepy, or off-balance.  Take over-the-counter and prescription medicines only as told by your health care provider. These include any supplements.  Eat a healthy diet and maintain a healthy weight. A healthy diet includes low-fat dairy products, low-fat (lean) meats, and fiber from whole grains, beans, and lots of fruits and vegetables. Home safety  Remove any tripping hazards, such as rugs, cords, and clutter.  Install safety equipment such as grab bars in bathrooms and safety rails on stairs.  Keep rooms and walkways well-lit. Activity  Follow a regular exercise program to stay fit. This will help you maintain your balance. Ask your health care provider what types of exercise are appropriate for you.  If you need a cane or walker,   use it as recommended by your health care provider.  Wear supportive shoes that have nonskid soles.   Lifestyle  Do not drink alcohol if your health care provider tells you not to drink.  If you drink alcohol, limit how much you have: ? 0-1 drink a day for women. ? 0-2 drinks a day for men.  Be aware of how much alcohol is in your drink. In the [place] one drink equals one typical bottle of beer (12  oz), one-half glass of wine (5 oz), or one shot of hard liquor (1 oz).  Do not use any products that contain nicotine or tobacco, such as cigarettes and e-cigarettes. If you need help quitting, ask your health care provider. Summary  Having a healthy lifestyle and getting preventive care can help to protect your health and wellness after age 70.  Screening and testing are the best way to find a health problem early and help you avoid having a fall. Early diagnosis and treatment give you the best chance for managing medical conditions that are more common for people who are older than age 70.  Falls are a major cause of broken bones and head injuries in people who are older than age 70. Take precautions to prevent a fall at home.  Work with your health care provider to learn what changes you can make to improve your health and wellness and to prevent falls. This information is not intended to replace advice given to you by your health care provider. Make sure you discuss any questions you have with your health care provider. Document Revised: 11/26/2018 Document Reviewed: 06/18/2017 Elsevier Patient Education  2021 [employer]  

## 2020-11-06 DIAGNOSIS — I10 Essential (primary) hypertension: Secondary | ICD-10-CM | POA: Diagnosis not present

## 2020-11-06 DIAGNOSIS — Z1329 Encounter for screening for other suspected endocrine disorder: Secondary | ICD-10-CM | POA: Diagnosis not present

## 2020-11-06 DIAGNOSIS — E782 Mixed hyperlipidemia: Secondary | ICD-10-CM | POA: Diagnosis not present

## 2020-11-06 DIAGNOSIS — Z0000 Encounter for general adult medical examination without abnormal findings: Secondary | ICD-10-CM | POA: Diagnosis not present

## 2020-11-06 DIAGNOSIS — Z79899 Other long term (current) drug therapy: Secondary | ICD-10-CM | POA: Diagnosis not present

## 2020-11-07 ENCOUNTER — Other Ambulatory Visit: Payer: Self-pay | Admitting: Physician Assistant

## 2020-11-07 DIAGNOSIS — E782 Mixed hyperlipidemia: Secondary | ICD-10-CM

## 2020-11-07 LAB — CBC WITH DIFFERENTIAL/PLATELET
Absolute Monocytes: 808 cells/uL (ref 200–950)
Basophils Absolute: 40 cells/uL (ref 0–200)
Basophils Relative: 0.4 %
Eosinophils Absolute: 253 cells/uL (ref 15–500)
Eosinophils Relative: 2.5 %
HCT: 47.3 % — ABNORMAL HIGH (ref 35.0–45.0)
Hemoglobin: 16 g/dL — ABNORMAL HIGH (ref 11.7–15.5)
Lymphs Abs: 4404 cells/uL — ABNORMAL HIGH (ref 850–3900)
MCH: 31.8 pg (ref 27.0–33.0)
MCHC: 33.8 g/dL (ref 32.0–36.0)
MCV: 94 fL (ref 80.0–100.0)
MPV: 9.8 fL (ref 7.5–12.5)
Monocytes Relative: 8 %
Neutro Abs: 4596 cells/uL (ref 1500–7800)
Neutrophils Relative %: 45.5 %
Platelets: 309 10*3/uL (ref 140–400)
RBC: 5.03 10*6/uL (ref 3.80–5.10)
RDW: 12.2 % (ref 11.0–15.0)
Total Lymphocyte: 43.6 %
WBC: 10.1 10*3/uL (ref 3.8–10.8)

## 2020-11-07 LAB — LIPID PANEL W/REFLEX DIRECT LDL
Cholesterol: 188 mg/dL (ref <200)
HDL: 60 mg/dL (ref >=50)
LDL Cholesterol (Calc): 95 mg/dL (calc)
Non-HDL Cholesterol (Calc): 128 mg/dL (calc) (ref <130)
Total CHOL/HDL Ratio: 3.1 (calc) (ref <5.0)
Triglycerides: 240 mg/dL — ABNORMAL HIGH (ref <150)

## 2020-11-07 LAB — COMPLETE METABOLIC PANEL WITH GFR
AG Ratio: 1.9 (calc) (ref 1.0–2.5)
ALT: 30 U/L — ABNORMAL HIGH (ref 6–29)
AST: 23 U/L (ref 10–35)
Albumin: 4.2 g/dL (ref 3.6–5.1)
Alkaline phosphatase (APISO): 88 U/L (ref 37–153)
BUN: 20 mg/dL (ref 7–25)
CO2: 26 mmol/L (ref 20–32)
Calcium: 10.1 mg/dL (ref 8.6–10.4)
Chloride: 107 mmol/L (ref 98–110)
Creat: 0.68 mg/dL (ref 0.60–0.93)
GFR, Est African American: 103 mL/min/{1.73_m2} (ref >=60)
GFR, Est Non African American: 89 mL/min/{1.73_m2} (ref >=60)
Globulin: 2.2 g/dL (calc) (ref 1.9–3.7)
Glucose, Bld: 89 mg/dL (ref 65–99)
Potassium: 4.8 mmol/L (ref 3.5–5.3)
Sodium: 142 mmol/L (ref 135–146)
Total Bilirubin: 0.6 mg/dL (ref 0.2–1.2)
Total Protein: 6.4 g/dL (ref 6.1–8.1)

## 2020-11-07 LAB — TSH: TSH: 4.21 mIU/L (ref 0.40–4.50)

## 2020-11-07 MED ORDER — ATORVASTATIN CALCIUM 40 MG PO TABS: ORAL_TABLET | ORAL | 3 refills | Status: DC

## 2020-11-07 NOTE — Progress Notes (Signed)
Olivia Frank,   Kidney and glucose look great.  One liver enzyme still up but down from last check.  LDL great. HDL wonderful.  TG are elevated. Stay on statin but try to make diet changes that support TG. Low sugar, carbs, processed foods. Eating more mediterranean.  Thyroid although in normal range is in the HYPO thyroid side of normal. We could consider treatment if you thought you were having hypothyroid symptoms.

## 2020-11-08 DIAGNOSIS — H6123 Impacted cerumen, bilateral: Secondary | ICD-10-CM | POA: Diagnosis not present

## 2020-12-13 ENCOUNTER — Other Ambulatory Visit: Payer: Self-pay | Admitting: Physician Assistant

## 2020-12-13 DIAGNOSIS — E782 Mixed hyperlipidemia: Secondary | ICD-10-CM

## 2020-12-29 ENCOUNTER — Ambulatory Visit (INDEPENDENT_AMBULATORY_CARE_PROVIDER_SITE_OTHER): Payer: Medicare Other | Admitting: Physician Assistant

## 2020-12-29 DIAGNOSIS — Z122 Encounter for screening for malignant neoplasm of respiratory organs: Secondary | ICD-10-CM | POA: Diagnosis not present

## 2020-12-29 DIAGNOSIS — F17200 Nicotine dependence, unspecified, uncomplicated: Secondary | ICD-10-CM

## 2020-12-29 DIAGNOSIS — Z0000 Encounter for general adult medical examination without abnormal findings: Secondary | ICD-10-CM | POA: Diagnosis not present

## 2020-12-29 DIAGNOSIS — J432 Centrilobular emphysema: Secondary | ICD-10-CM

## 2020-12-29 DIAGNOSIS — Z1231 Encounter for screening mammogram for malignant neoplasm of breast: Secondary | ICD-10-CM | POA: Diagnosis not present

## 2020-12-29 NOTE — Progress Notes (Addendum)
MEDICARE ANNUAL WELLNESS VISIT  12/29/2020  Telephone Visit Disclaimer This Medicare AWV was conducted by telephone due to national recommendations for restrictions regarding the COVID-19 Pandemic (e.g. social distancing).  I verified, using two identifiers, that I am speaking with Olivia Frank or their authorized healthcare agent. I discussed the limitations, risks, security, and privacy concerns of performing an evaluation and management service by telephone and the potential availability of an in-person appointment in the future. The patient expressed understanding and agreed to proceed.  Location of Patient: Home Location of Provider (nurse):  In the office.  Subjective:    Olivia Frank is a 70 y.o. female patient of Caleen Essex, Lonna Cobb, PA-C who had a [employer] Visit today via telephone. [name] is Retired and lives with their son. she has 2 children. she reports that she is socially active and does interact with friends/family regularly. she is moderately physically active and enjoys playing with her dog.  Patient Care Team: Nolene Ebbs as PCP - General (Family Medicine)  Advanced Directives 12/29/2020 08/03/2019 07/29/2018 11/06/2015 05/17/2015  Does Patient Have a Medical Advance Directive? Yes Yes Yes No No  Type of Estate agent of [place];Living will Healthcare Power of [place];Living will Healthcare Power of [place];Living will - -  Does patient want to make changes to medical advance directive? - No - Patient declined No - Patient declined - -  Copy of Healthcare Power of Attorney in Chart? No - copy requested No - copy requested No - copy requested - -  Would patient like information on creating a medical advance directive? No - Patient declined - - No - patient declined information Yes - Educational materials given    Hospital Utilization Over the Past 12 Months: # of hospitalizations or ER visits: 0 # of surgeries:  0  Review of Systems    Patient reports that her overall health is unchanged compared to last year.  History obtained from chart review and the patient  Patient Reported Readings (BP, Pulse, CBG, Weight, etc) none  Pain Assessment Pain : No/denies pain     Current Medications & Allergies (verified) Allergies as of 12/29/2020      Reactions   Morphine And Related Hives, Itching   Ace Inhibitors    cough      Medication List       Accurate as of Dec 29, 2020  1:53 PM. If you have any questions, ask your nurse or doctor.        albuterol 108 (90 Base) MCG/ACT inhaler Commonly known as: VENTOLIN HFA Inhale 2 puffs into the lungs every 4 (four) hours as needed for wheezing or shortness of breath.   atorvastatin 40 MG tablet Commonly known as: LIPITOR TAKE 1 TABLET BY MOUTH ONCE DAILY -  NEEDS  LABS   Fish Oil 1000 MG Caps Take by mouth.   vitamin B-12 1000 MCG tablet Commonly known as: CYANOCOBALAMIN Take 1,000 mcg by mouth daily.   VITAMIN D (CHOLECALCIFEROL) PO Take 5,000 Units 3 (three) times daily by mouth.       History (reviewed): Past Medical History:  Diagnosis Date  . COPD (chronic obstructive pulmonary disease) (HCC)   . Melanoma (HCC)   . Seasonal allergies    Past Surgical History:  Procedure Laterality Date  . ABDOMINAL HYSTERECTOMY    . APPENDECTOMY    . OVARIAN CYST REMOVAL     Family History  Problem Relation Age of Onset  . Cancer Mother  breast  . Hyperlipidemia Mother   . Heart disease Father   . Cancer Sister   . Seizures Sister   . Cancer Maternal Grandmother        breast  . Cancer Maternal Grandfather        colon   Social History   Socioeconomic History  . Marital status: Married    Spouse name: Elberta Fortis  . Number of children: 2  . Years of education: 26  . Highest education level: 12th grade  Occupational History  . 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  Smoking status: Current Every Day Smoker    Packs/day: 1.00    Years: 51.00    Pack years: 51.00    Types: Cigarettes  . Smokeless tobacco: Never Used  Vaping Use  . Vaping Use: Never used  Substance and Sexual Activity  . Alcohol use: Yes    Alcohol/week: 1.0 standard drink    Types: 1 Glasses of wine per week    Comment: a night with different  . Drug use: No  . Sexual activity: Not Currently  Other Topics Concern  . Not on file  Social History Narrative   Her son is living with her at the moment. She had to move her husband into an assisted living facility last week. She has a dog and walks about 2-3 miles a day. She is still smoking one pack per day.    Social Determinants of Health   Financial Resource Strain: Low Risk   . Difficulty of Paying Living Expenses: Not hard at all  Food Insecurity: No Food Insecurity  . Worried About Charity fundraiser in the Last Year: Never true  . Ran Out of Food in the Last Year: Never true  Transportation Needs: No Transportation Needs  . Lack of Transportation (Medical): No  . Lack of Transportation (Non-Medical): No  Physical Activity: Sufficiently Active  . Days of Exercise per Week: 7 days  . Minutes of Exercise per Session: 30 min  Stress: No Stress Concern Present  . Feeling of Stress : Not at all  Social Connections: Moderately Isolated  . Frequency of Communication with Friends and Family: More than three times a week  . Frequency of Social Gatherings with Friends and Family: Twice a week  . Attends Religious Services: Never  . Active Member of Clubs or Organizations: No  . Attends Archivist Meetings: Never  . Marital Status: Married    Activities of Daily Living In your present state of health, do you have any difficulty performing the following activities: 12/29/2020  Hearing? N  Vision? Y  Comment vision is still blurry even with glasses but she sees her eye doctor every six months.  Difficulty concentrating  or making decisions? N  Walking or climbing stairs? N  Dressing or bathing? N  Doing errands, shopping? N  Preparing Food and eating ? N  Using the Toilet? N  In the past six months, have you accidently leaked urine? N  Do you have problems with loss of bowel control? N  Managing your Medications? N  Managing your Finances? N  Housekeeping or managing your Housekeeping? N  Some recent data might be hidden    Patient Education/ Literacy How often do you need to have someone help you when you read instructions, pamphlets, or other written materials from your doctor or pharmacy?: 1 - Never What is the last grade level you completed  in school?: 12th grade  Exercise Current Exercise Habits: Home exercise routine, Type of exercise: walking, Time (Minutes): 30, Frequency (Times/Week): >7, Weekly Exercise (Minutes/Week): 0, Intensity: Moderate, Exercise limited by: None identified  Diet Patient reports consuming 3 meals a day and 0-1 snack(s) a day Patient reports that her primary diet is: Regular Patient reports that she does have regular access to food.   Depression Screen PHQ 2/9 Scores 12/29/2020 11/03/2020 11/03/2019 08/03/2019 07/29/2018 01/28/2018 11/25/2016  PHQ - 2 Score 3 1 2  0 0 1 1  PHQ- 9 Score 5 3 4  - - - -     Fall Risk Fall Risk  12/29/2020 11/03/2020 11/03/2019 08/03/2019 07/14/2019  Falls in the past year? 0 0 1 0 0  Comment - - - - Emmi Telephone Survey: data to providers prior to load  Number falls in past yr: 0 0 1 0 -  Injury with Fall? 0 0 0 0 -  Risk for fall due to : No Fall Risks - History of fall(s) - -  Follow up Falls evaluation completed Falls evaluation completed Falls evaluation completed Falls prevention discussed -     Objective:  Der Gagliano seemed alert and oriented and she participated appropriately during our telephone visit.  Blood Pressure Weight BMI  BP Readings from Last 3 Encounters:  11/03/20 (!) 152/76  04/17/20 115/65  01/31/20 136/84    Wt Readings from Last 3 Encounters:  11/03/20 176 lb (79.8 kg)  04/17/20 174 lb (78.9 kg)  01/31/20 178 lb (80.7 kg)   BMI Readings from Last 1 Encounters:  11/03/20 29.29 kg/m    *Unable to obtain current vital signs, weight, and BMI due to telephone visit type  Hearing/Vision  . [place] did not seem to have difficulty with hearing/understanding during the telephone conversation . Reports that she has had a formal eye exam by an eye care professional within the past year . Reports that she has not had a formal hearing evaluation within the past year *Unable to fully assess hearing and vision during telephone visit type  Cognitive Function: 6CIT Screen 12/29/2020 11/03/2019 08/03/2019 07/29/2018 11/25/2016  What Year? 0 points 0 points 0 points 0 points 0 points  What month? 0 points 0 points 0 points 0 points 0 points  What time? 0 points 0 points 0 points 0 points 0 points  Count back from 20 0 points 0 points 0 points 0 points 0 points  Months in reverse 0 points 0 points 0 points 0 points 0 points  Repeat phrase 0 points 2 points 2 points 0 points 0 points  Total Score 0 2 2 0 0   (Normal:0-7, Significant for Dysfunction: >8)  Normal Cognitive Function Screening: Yes   Immunization & Health Maintenance Record Immunization History  Administered Date(s) Administered  . Fluad Quad(high Dose 65+) 04/06/2019, 06/01/2020  . Influenza, High Dose Seasonal PF 05/26/2018  . Influenza,inj,Quad PF,6+ Mos 06/23/2017, 06/24/2017  . PFIZER(Purple Top)SARS-COV-2 Vaccination 09/16/2019, 10/14/2019, 05/17/2020  . Pneumococcal Conjugate-13 11/25/2016  . Pneumococcal Polysaccharide-23 01/28/2018  . Td 05/18/2009  . Tdap 03/20/2019  . Zoster 10/15/2011    Health Maintenance  Topic Date Due  . COVID-19 Vaccine (4 - Booster for [place] series) 01/14/2021 (Originally 08/16/2020)  . INFLUENZA VACCINE  03/19/2021  . MAMMOGRAM  03/22/2022  . COLONOSCOPY (Pts 45-20yrs Insurance coverage will  need to be confirmed)  12/20/2026  . TETANUS/TDAP  03/19/2029  . DEXA SCAN  Completed  . Hepatitis C Screening  Completed  .  PNA vac Low Risk Adult  Completed  . HPV VACCINES  Aged Out       Assessment  This is a routine wellness examination for [facility].  Health Maintenance: Due or Overdue There are no preventive care reminders to display for this patient.  Biagio Borg does not need a referral for Community Assistance: Care Management:   no Social Work:    no Prescription Assistance:  no Nutrition/Diabetes Education:  no   Plan:  Personalized Goals Goals Addressed              This Visit's Progress   .  Patient Stated (pt-stated)        12/29/2020 AWV Goal: Tobacco Cessation  Smoking cessation instruction/counseling given:  counseled patient on the dangers of tobacco use, advised patient to stop smoking, and reviewed strategies to maximize success   Patient will verbalize understanding of the health risks associated with smoking/tobacco use  Lung cancer or lung disease, such as COPD  Heart disease.  Stroke.  Heart attack  Infertility  Osteoporosis and bone fractures. . Patient will create a plan to quit smoking/using tobacco  Pick a date to quit.   Write down the reasons why you are quitting and put it where you will see it often.  Identify the people, places, things, and activities that make you want to smoke (triggers) and avoid them. Make sure to take these actions: ? Throw away all cigarettes at home, at work, and in your car. ? Throw away smoking accessories, such as Scientist, research (medical). ? Clean your car and make sure to empty the ashtray. ? Clean your home, including curtains and carpets.  Tell your family, friends, and coworkers that you are quitting. Support from your loved ones can make quitting easier.  Talk with your health care provider about your options for quitting smoking.  Find out what treatment options are covered by  your health insurance. . Patient will be able to demonstrate knowledge of tobacco cessation strategies that may maximize success  Quitting "cold [place]" is more successful than gradually quitting.  Attending in-person counseling to help you build problem-solving skills.   Finding resources and support systems that can help you to quit smoking and remain smoke-free after you quit. These resources are most helpful when you use them often. They can include: ? Online chats with a Social worker. ? Telephone quitlines. ? Careers information officer. ? Support groups or group counseling. ? Text messaging programs. ? Mobile phone applications.  Taking medicines to help you quit smoking: ? Nicotine patches, gum, or lozenges. ? Nicotine inhalers or sprays. ? Non-nicotine medicine that is taken by mouth. . Patient will note get discouraged if the process is difficult . Over the next year, patient will stop smoking or using other forms of tobacco  Smoking cessation instruction/counseling given:  counseled patient on the dangers of tobacco use, advised patient to stop smoking, and reviewed strategies to maximize success        Personalized Health Maintenance & Screening Recommendations  Smoking cessation counseling  Mammogram in August.  Lung Cancer Screening Recommended: yes (Low Dose CT Chest recommended if Age 53-80 years, 30 pack-year currently smoking OR have quit w/in past 15 years) Hepatitis C Screening recommended: no HIV Screening recommended: no  Advanced Directives: Written information was not prepared per patient's request.  Referrals & Orders Orders Placed This Encounter  Procedures  . Mammogram 3D SCREEN BREAST BILATERAL  . Ambulatory Referral for Lung Cancer Scre  Follow-up Plan . Follow-up with Donella Stade, PA-C as planned . Referral for your Chest CT and Mammogram has been sent.  . Medicare wellness visit in one year.   I have personally reviewed and noted the  following in the patient's chart:   . Medical and social history . Use of alcohol, tobacco or illicit drugs  . Current medications and supplements . Functional ability and status . Nutritional status . Physical activity . Advanced directives . List of other physicians . Hospitalizations, surgeries, and ER visits in previous 12 months . Vitals . Screenings to include cognitive, depression, and falls . Referrals and appointments  In addition, I have reviewed and discussed with Biagio Borg certain preventive protocols, quality metrics, and best practice recommendations. A written personalized care plan for preventive services as well as general preventive health recommendations is available and can be mailed to the patient at her request.      Tinnie Gens, RN  12/29/2020

## 2020-12-29 NOTE — Patient Instructions (Addendum)
[facility] Maintenance Summary and Written Plan of Care  Olivia Frank ,  Thank you for allowing me to perform your Medicare Annual Wellness Visit and for your ongoing commitment to your health.   Health Maintenance & Immunization History Health Maintenance  Topic Date Due  . COVID-19 Vaccine (4 - Booster for [place] series) 01/14/2021 (Originally 08/16/2020)  . INFLUENZA VACCINE  03/19/2021  . MAMMOGRAM  03/22/2022  . COLONOSCOPY (Pts 45-89yrs Insurance coverage will need to be confirmed)  12/20/2026  . TETANUS/TDAP  03/19/2029  . DEXA SCAN  Completed  . Hepatitis C Screening  Completed  . PNA vac Low Risk Adult  Completed  . HPV VACCINES  Aged Out   Immunization History  Administered Date(s) Administered  . Fluad Quad(high Dose 65+) 04/06/2019, 06/01/2020  . Influenza, High Dose Seasonal PF 05/26/2018  . Influenza,inj,Quad PF,6+ Mos 06/23/2017, 06/24/2017  . PFIZER(Purple Top)SARS-COV-2 Vaccination 09/16/2019, 10/14/2019, 05/17/2020  . Pneumococcal Conjugate-13 11/25/2016  . Pneumococcal Polysaccharide-23 01/28/2018  . Td 05/18/2009  . Tdap 03/20/2019  . Zoster 10/15/2011    These are the patient goals that we discussed: Goals Addressed              This Visit's Progress   .  Patient Stated (pt-stated)        12/29/2020 AWV Goal: Tobacco Cessation  Smoking cessation instruction/counseling given:  counseled patient on the dangers of tobacco use, advised patient to stop smoking, and reviewed strategies to maximize success   Patient will verbalize understanding of the health risks associated with smoking/tobacco use  Lung cancer or lung disease, such as COPD  Heart disease.  Stroke.  Heart attack  Infertility  Osteoporosis and bone fractures. . Patient will create a plan to quit smoking/using tobacco  Pick a date to quit.   Write down the reasons why you are quitting and put it where you will see it often.  Identify the  people, places, things, and activities that make you want to smoke (triggers) and avoid them. Make sure to take these actions: ? Throw away all cigarettes at home, at work, and in your car. ? Throw away smoking accessories, such as Scientist, research (medical). ? Clean your car and make sure to empty the ashtray. ? Clean your home, including curtains and carpets.  Tell your family, friends, and coworkers that you are quitting. Support from your loved ones can make quitting easier.  Talk with your health care provider about your options for quitting smoking.  Find out what treatment options are covered by your health insurance. . Patient will be able to demonstrate knowledge of tobacco cessation strategies that may maximize success  Quitting "cold [place]" is more successful than gradually quitting.  Attending in-person counseling to help you build problem-solving skills.   Finding resources and support systems that can help you to quit smoking and remain smoke-free after you quit. These resources are most helpful when you use them often. They can include: ? Online chats with a Social worker. ? Telephone quitlines. ? Careers information officer. ? Support groups or group counseling. ? Text messaging programs. ? Mobile phone applications.  Taking medicines to help you quit smoking: ? Nicotine patches, gum, or lozenges. ? Nicotine inhalers or sprays. ? Non-nicotine medicine that is taken by mouth. . Patient will note get discouraged if the process is difficult . Over the next year, patient will stop smoking or using other forms of tobacco  Smoking cessation instruction/counseling given:  counseled patient on the  dangers of tobacco use, advised patient to stop smoking, and reviewed strategies to maximize success          This is a list of Health Maintenance Items that are overdue or due now: Smoking cessation counseling  Mammogram in August.  Orders/Referrals Placed Today: Orders Placed  This Encounter  Procedures  . Mammogram 3D SCREEN BREAST BILATERAL    Standing Status:   Future    Standing Expiration Date:   12/29/2021    Scheduling Instructions:     Please call patient to schedule. She will be due in August.    Order Specific Question:   Reason for Exam (SYMPTOM  OR DIAGNOSIS REQUIRED)    Answer:   Breast cancer screening    Order Specific Question:   Preferred imaging location?    Answer:   [facility]  . Ambulatory Referral for Lung Cancer Scre    Referral Priority:   Routine    Referral Type:   Consultation    Referral Reason:   Specialty Services Required    Number of Visits Requested:   1   (Contact our referral department at [phone] if you have not spoken with someone about your referral appointment within the next 5 days)    Follow-up Plan . Follow-up with Donella Stade, PA-C as planned . Referral for your Chest CT and Mammogram has been sent.  . Medicare wellness visit in one year.      Health Maintenance, Female Adopting a healthy lifestyle and getting preventive care are important in promoting health and wellness. Ask your health care provider about:  The right schedule for you to have regular tests and exams.  Things you can do on your own to prevent diseases and keep yourself healthy. What should I know about diet, weight, and exercise? Eat a healthy diet  Eat a diet that includes plenty of vegetables, fruits, low-fat dairy products, and lean protein.  Do not eat a lot of foods that are high in solid fats, added sugars, or sodium.   Maintain a healthy weight Body mass index (BMI) is used to identify weight problems. It estimates body fat based on height and weight. Your health care provider can help determine your BMI and help you achieve or maintain a healthy weight. Get regular exercise Get regular exercise. This is one of the most important things you can do for your health. Most adults should:  Exercise for at least  150 minutes each week. The exercise should increase your heart rate and make you sweat (moderate-intensity exercise).  Do strengthening exercises at least twice a week. This is in addition to the moderate-intensity exercise.  Spend less time sitting. Even light physical activity can be beneficial. Watch cholesterol and blood lipids Have your blood tested for lipids and cholesterol at 70 years of age, then have this test every 5 years. Have your cholesterol levels checked more often if:  Your lipid or cholesterol levels are high.  You are older than 70 years of age.  You are at high risk for heart disease. What should I know about cancer screening? Depending on your health history and family history, you may need to have cancer screening at various ages. This may include screening for:  Breast cancer.  Cervical cancer.  Colorectal cancer.  Skin cancer.  Lung cancer. What should I know about heart disease, diabetes, and high blood pressure? Blood pressure and heart disease  High blood pressure causes heart disease and increases the risk of stroke.  This is more likely to develop in people who have high blood pressure readings, are of African descent, or are overweight.  Have your blood pressure checked: ? Every 3-5 years if you are 52-61 years of age. ? Every year if you are 57 years old or older. Diabetes Have regular diabetes screenings. This checks your fasting blood sugar level. Have the screening done:  Once every three years after age 62 if you are at a normal weight and have a low risk for diabetes.  More often and at a younger age if you are overweight or have a high risk for diabetes. What should I know about preventing infection? Hepatitis B If you have a higher risk for hepatitis B, you should be screened for this virus. Talk with your health care provider to find out if you are at risk for hepatitis B infection. Hepatitis C Testing is recommended for:  Everyone  born from [birth year] through 1965.  Anyone with known risk factors for hepatitis C. Sexually transmitted infections (STIs)  Get screened for STIs, including gonorrhea and chlamydia, if: ? You are sexually active and are younger than 70 years of age. ? You are older than 70 years of age and your health care provider tells you that you are at risk for this type of infection. ? Your sexual activity has changed since you were last screened, and you are at increased risk for chlamydia or gonorrhea. Ask your health care provider if you are at risk.  Ask your health care provider about whether you are at high risk for HIV. Your health care provider may recommend a prescription medicine to help prevent HIV infection. If you choose to take medicine to prevent HIV, you should first get tested for HIV. You should then be tested every 3 months for as long as you are taking the medicine. Pregnancy  If you are about to stop having your period (premenopausal) and you may become pregnant, seek counseling before you get pregnant.  Take 400 to 800 micrograms (mcg) of folic acid every day if you become pregnant.  Ask for birth control (contraception) if you want to prevent pregnancy. Osteoporosis and menopause Osteoporosis is a disease in which the bones lose minerals and strength with aging. This can result in bone fractures. If you are 3 years old or older, or if you are at risk for osteoporosis and fractures, ask your health care provider if you should:  Be screened for bone loss.  Take a calcium or vitamin D supplement to lower your risk of fractures.  Be given hormone replacement therapy (HRT) to treat symptoms of menopause. Follow these instructions at home: Lifestyle  Do not use any products that contain nicotine or tobacco, such as cigarettes, e-cigarettes, and chewing tobacco. If you need help quitting, ask your health care provider.  Do not use street drugs.  Do not share needles.  Ask your  health care provider for help if you need support or information about quitting drugs. Alcohol use  Do not drink alcohol if: ? Your health care provider tells you not to drink. ? You are pregnant, may be pregnant, or are planning to become pregnant.  If you drink alcohol: ? Limit how much you use to 0-1 drink a day. ? Limit intake if you are breastfeeding.  Be aware of how much alcohol is in your drink. In the [place] one drink equals one 12 oz bottle of beer (355 mL), one 5 oz glass of wine (148 mL),  or one 1 oz glass of hard liquor (44 mL). General instructions  Schedule regular health, dental, and eye exams.  Stay current with your vaccines.  Tell your health care provider if: ? You often feel depressed. ? You have ever been abused or do not feel safe at home. Summary  Adopting a healthy lifestyle and getting preventive care are important in promoting health and wellness.  Follow your health care provider's instructions about healthy diet, exercising, and getting tested or screened for diseases.  Follow your health care provider's instructions on monitoring your cholesterol and blood pressure. This information is not intended to replace advice given to you by your health care provider. Make sure you discuss any questions you have with your health care provider. Document Revised: 07/29/2018 Document Reviewed: 07/29/2018 Elsevier Patient Education  2021 [employer].

## 2021-02-18 DIAGNOSIS — Z20822 Contact with and (suspected) exposure to covid-19: Secondary | ICD-10-CM | POA: Diagnosis not present

## 2021-03-07 ENCOUNTER — Other Ambulatory Visit: Payer: Self-pay | Admitting: *Deleted

## 2021-03-07 DIAGNOSIS — F17210 Nicotine dependence, cigarettes, uncomplicated: Secondary | ICD-10-CM

## 2021-03-07 DIAGNOSIS — Z87891 Personal history of nicotine dependence: Secondary | ICD-10-CM

## 2021-03-29 ENCOUNTER — Other Ambulatory Visit: Payer: Self-pay

## 2021-03-29 ENCOUNTER — Ambulatory Visit (INDEPENDENT_AMBULATORY_CARE_PROVIDER_SITE_OTHER): Payer: Medicare Other

## 2021-03-29 DIAGNOSIS — F17210 Nicotine dependence, cigarettes, uncomplicated: Secondary | ICD-10-CM | POA: Diagnosis not present

## 2021-03-29 DIAGNOSIS — Z1231 Encounter for screening mammogram for malignant neoplasm of breast: Secondary | ICD-10-CM

## 2021-03-29 DIAGNOSIS — Z0000 Encounter for general adult medical examination without abnormal findings: Secondary | ICD-10-CM

## 2021-03-29 DIAGNOSIS — Z87891 Personal history of nicotine dependence: Secondary | ICD-10-CM | POA: Diagnosis not present

## 2021-04-02 NOTE — Progress Notes (Signed)
Normal mammogram. Follow up in 1 year.

## 2021-04-05 NOTE — Progress Notes (Signed)
Please call patient and let them  know their  low dose Ct was read as a Lung RADS 2: nodules that are benign in appearance and behavior with a very low likelihood of becoming a clinically active cancer due to size or lack of growth. Recommendation per radiology is for a repeat LDCT in 12 months. .Please let them  know we will order and schedule their  annual screening scan for 03/2022. Please let them  know there was notation of CAD on their  scan.  Please remind the patient  that this is a non-gated exam therefore degree or severity of disease  cannot be determined. Please have them  follow up with their PCP regarding potential risk factor modification, dietary therapy or pharmacologic therapy if clinically indicated. Pt.  is  currently on statin therapy. Please place order for annual  screening scan for  03/2022 and fax results to PCP. Thanks so much. There is notation of aortic atherosclerosis, in addition to left main and 3 vessel coronary artery disease. She is on statins. Last cards OV was 10/2019. Have her follow up with PCP. Thanks

## 2021-04-09 ENCOUNTER — Encounter: Payer: Self-pay | Admitting: *Deleted

## 2021-04-09 DIAGNOSIS — F17210 Nicotine dependence, cigarettes, uncomplicated: Secondary | ICD-10-CM

## 2021-04-09 DIAGNOSIS — Z87891 Personal history of nicotine dependence: Secondary | ICD-10-CM

## 2021-04-12 DIAGNOSIS — U071 COVID-19: Secondary | ICD-10-CM | POA: Diagnosis not present

## 2021-04-30 DIAGNOSIS — L299 Pruritus, unspecified: Secondary | ICD-10-CM | POA: Diagnosis not present

## 2021-04-30 DIAGNOSIS — H6123 Impacted cerumen, bilateral: Secondary | ICD-10-CM | POA: Diagnosis not present

## 2021-05-03 DIAGNOSIS — D225 Melanocytic nevi of trunk: Secondary | ICD-10-CM | POA: Diagnosis not present

## 2021-05-03 DIAGNOSIS — Z85828 Personal history of other malignant neoplasm of skin: Secondary | ICD-10-CM | POA: Diagnosis not present

## 2021-05-03 DIAGNOSIS — D485 Neoplasm of uncertain behavior of skin: Secondary | ICD-10-CM | POA: Diagnosis not present

## 2021-05-03 DIAGNOSIS — L814 Other melanin hyperpigmentation: Secondary | ICD-10-CM | POA: Diagnosis not present

## 2021-05-03 DIAGNOSIS — Z85820 Personal history of malignant melanoma of skin: Secondary | ICD-10-CM | POA: Diagnosis not present

## 2021-05-03 DIAGNOSIS — Z08 Encounter for follow-up examination after completed treatment for malignant neoplasm: Secondary | ICD-10-CM | POA: Diagnosis not present

## 2021-05-03 DIAGNOSIS — L821 Other seborrheic keratosis: Secondary | ICD-10-CM | POA: Diagnosis not present

## 2021-05-03 DIAGNOSIS — L538 Other specified erythematous conditions: Secondary | ICD-10-CM | POA: Diagnosis not present

## 2021-05-03 DIAGNOSIS — B078 Other viral warts: Secondary | ICD-10-CM | POA: Diagnosis not present

## 2021-05-03 DIAGNOSIS — Z872 Personal history of diseases of the skin and subcutaneous tissue: Secondary | ICD-10-CM | POA: Diagnosis not present

## 2021-05-03 DIAGNOSIS — L732 Hidradenitis suppurativa: Secondary | ICD-10-CM | POA: Diagnosis not present

## 2021-05-17 ENCOUNTER — Other Ambulatory Visit (INDEPENDENT_AMBULATORY_CARE_PROVIDER_SITE_OTHER): Payer: Medicare Other | Admitting: Neurology

## 2021-05-17 ENCOUNTER — Other Ambulatory Visit: Payer: Self-pay

## 2021-05-17 ENCOUNTER — Ambulatory Visit (INDEPENDENT_AMBULATORY_CARE_PROVIDER_SITE_OTHER): Payer: Medicare Other | Admitting: Physician Assistant

## 2021-05-17 DIAGNOSIS — Z23 Encounter for immunization: Secondary | ICD-10-CM | POA: Diagnosis not present

## 2021-05-17 NOTE — Progress Notes (Signed)
Patient ID: Olivia Frank, female   DOB: [birth year], 70 y.o.   MRN: [id number] Agree with above plan.

## 2021-05-17 NOTE — Progress Notes (Signed)
Patient is here for a flu vaccine. Verified no previous allergy to flu vaccine, eggs, or latex. Flu injection to left deltoid with no apparent complications. Patient advised to call with any problems.   

## 2021-07-18 ENCOUNTER — Other Ambulatory Visit: Payer: Self-pay | Admitting: Physician Assistant

## 2021-07-18 DIAGNOSIS — J441 Chronic obstructive pulmonary disease with (acute) exacerbation: Secondary | ICD-10-CM

## 2021-07-24 DIAGNOSIS — Z20822 Contact with and (suspected) exposure to covid-19: Secondary | ICD-10-CM | POA: Diagnosis not present

## 2021-08-15 DIAGNOSIS — U071 COVID-19: Secondary | ICD-10-CM | POA: Diagnosis not present

## 2021-10-01 DIAGNOSIS — Z20822 Contact with and (suspected) exposure to covid-19: Secondary | ICD-10-CM | POA: Diagnosis not present

## 2021-10-03 ENCOUNTER — Ambulatory Visit (INDEPENDENT_AMBULATORY_CARE_PROVIDER_SITE_OTHER): Payer: Medicare Other | Admitting: Sports Medicine

## 2021-10-03 ENCOUNTER — Other Ambulatory Visit: Payer: Self-pay

## 2021-10-03 DIAGNOSIS — M7712 Lateral epicondylitis, left elbow: Secondary | ICD-10-CM

## 2021-10-03 NOTE — Progress Notes (Signed)
° ° °  Procedures performed today:    None.  Independent interpretation of notes and tests performed by another provider:   None.  Brief History, Exam, Impression, and Recommendations:    Lateral epicondylitis, left elbow Classic tennis elbow, she will get a tennis elbow brace over-the-counter, she will do some over-the-counter ibuprofen, home exercises given, return to see me in a month, injection if no better    ___________________________________________ Gwen Her. Dianah Field, M.D., ABFM., CAQSM. Primary Care and [place] In[employer] of [employer] [facility]

## 2021-10-03 NOTE — Assessment & Plan Note (Signed)
Classic tennis elbow, she will get a tennis elbow brace over-the-counter, she will do some over-the-counter ibuprofen, home exercises given, return to see me in a month, injection if no better

## 2021-10-29 DIAGNOSIS — Z20822 Contact with and (suspected) exposure to covid-19: Secondary | ICD-10-CM | POA: Diagnosis not present

## 2021-10-31 ENCOUNTER — Ambulatory Visit: Payer: Medicare Other | Admitting: Sports Medicine

## 2021-11-12 DIAGNOSIS — Z20822 Contact with and (suspected) exposure to covid-19: Secondary | ICD-10-CM | POA: Diagnosis not present

## 2021-11-30 DIAGNOSIS — Z20822 Contact with and (suspected) exposure to covid-19: Secondary | ICD-10-CM | POA: Diagnosis not present

## 2021-12-03 DIAGNOSIS — Z20822 Contact with and (suspected) exposure to covid-19: Secondary | ICD-10-CM | POA: Diagnosis not present

## 2021-12-19 DIAGNOSIS — Z20822 Contact with and (suspected) exposure to covid-19: Secondary | ICD-10-CM | POA: Diagnosis not present

## 2021-12-24 DIAGNOSIS — Z20822 Contact with and (suspected) exposure to covid-19: Secondary | ICD-10-CM | POA: Diagnosis not present

## 2021-12-27 ENCOUNTER — Encounter: Payer: Self-pay | Admitting: Sports Medicine

## 2022-01-13 ENCOUNTER — Other Ambulatory Visit: Payer: Self-pay | Admitting: Physician Assistant

## 2022-01-13 DIAGNOSIS — E782 Mixed hyperlipidemia: Secondary | ICD-10-CM

## 2022-01-15 ENCOUNTER — Ambulatory Visit (INDEPENDENT_AMBULATORY_CARE_PROVIDER_SITE_OTHER): Payer: Medicare Other | Admitting: Family Medicine

## 2022-01-15 VITALS — Ht 65.0 in | Wt 174.0 lb

## 2022-01-15 DIAGNOSIS — Z0000 Encounter for general adult medical examination without abnormal findings: Secondary | ICD-10-CM

## 2022-01-15 NOTE — Progress Notes (Signed)
MEDICARE ANNUAL WELLNESS VISIT  01/15/2022  Telephone Visit Disclaimer This Medicare AWV was conducted by telephone due to national recommendations for restrictions regarding the COVID-19 Pandemic (e.g. social distancing).  I verified, using two identifiers, that I am speaking with Olivia Frank or their authorized healthcare agent. I discussed the limitations, risks, security, and privacy concerns of performing an evaluation and management service by telephone and the potential availability of an in-person appointment in the future. The patient expressed understanding and agreed to proceed.  Location of Patient: Home Location of Provider (nurse):  In the office.  Subjective:    Olivia Frank is a 71 y.o. female patient of Alden Hipp, Olivia Car, PA-C who had a [employer] Visit today via telephone. Olivia Frank is Retired and lives with their son. she has 2 children. she reports that she is socially active and does interact with friends/family regularly. she is minimally physically active and enjoys reading.  Patient Care Team: Olivia Frank as PCP - General (Family Medicine)     01/15/2022    3:05 PM 12/29/2020    1:31 PM 08/03/2019    9:19 AM 07/29/2018    9:09 AM 11/06/2015    3:16 PM 05/17/2015    8:42 AM  Advanced Directives  Does Patient Have a Medical Advance Directive? Yes Yes Yes Yes No No  Type of Advance Directive Living will;Healthcare Power of [place];Living will [place];Living will [place];Living will    Does patient want to make changes to medical advance directive? No - Patient declined  No - Patient declined No - Patient declined    Copy of [place] in Chart? Yes - validated most recent copy scanned in chart (See row information) No - copy requested No - copy requested No - copy requested    Would patient like information on creating a medical advance directive?   No - Patient declined   No - patient declined information Yes - Educational materials given    Hospital Utilization Over the Past 12 Months: # of hospitalizations or ER visits: 0 # of surgeries: 0  Review of Systems    Patient reports that her overall health is unchanged compared to last year.  History obtained from chart review and the patient  Patient Reported Readings (BP, Pulse, CBG, Weight, etc) Weight: 174lb Height: 39f  Pain Assessment Pain : No/denies pain     Current Medications & Allergies (verified) Allergies as of 01/15/2022       Reactions   Morphine And Related Hives, Itching   Ace Inhibitors    cough        Medication List        Accurate as of Jan 15, 2022  3:09 PM. If you have any questions, ask your nurse or doctor.          atorvastatin 40 MG tablet Commonly known as: LIPITOR Take 1 tablet by mouth once daily/ labs for refills   Fish Oil 1000 MG Caps Take by mouth.   Ventolin HFA 108 (90 Base) MCG/ACT inhaler Generic drug: albuterol INHALE 2 PUFFS INTO LUNGS EVERY 4 HOURS AS NEEDED FOR WHEEZING AND FOR SHORTNESS OF BREATH   vitamin B-12 1000 MCG tablet Commonly known as: CYANOCOBALAMIN Take 1,000 mcg by mouth daily.   VITAMIN D (CHOLECALCIFEROL) PO Take 5,000 Units 3 (three) times daily by mouth.        History (reviewed): Past Medical History:  Diagnosis  Date   COPD (chronic obstructive pulmonary disease) ([place])    Melanoma ([place])    Seasonal allergies    Past Surgical History:  Procedure Laterality Date   ABDOMINAL HYSTERECTOMY     APPENDECTOMY     OVARIAN CYST REMOVAL     Family History  Problem Relation Age of Onset   Cancer Mother        breast   Hyperlipidemia Mother    Heart disease Father    Cancer Sister    Seizures Sister    Cancer Maternal Grandmother        breast   Cancer Maternal Grandfather        colon   Social History   Socioeconomic History   Marital status: Married    Spouse name: Elberta Fortis    Number of children: 2   Years of education: 12   Highest education level: 12th grade  Occupational History   Occupation: retired    Comment: Air cabin crew for [employer]  Tobacco Use   Smoking status: Every Day    Packs/day: 1.00    Years: 51.00    Pack years: 51.00    Types: Cigarettes   Smokeless tobacco: Never  Vaping Use   Vaping Use: Never used  Substance and Sexual Activity   Alcohol use: Yes    Alcohol/week: 3.0 standard drinks    Types: 3 Glasses of wine per week    Comment: a night with different   Drug use: No   Sexual activity: Not Currently  Other Topics Concern   Not on file  Social History Narrative   Her son is living with her at the moment. She had to move her husband into an assisted living facility. She has a dog and walks about 2-3 miles a day. She is still smoking one pack per day. She enjoys reading and walking.   Social Determinants of Health   Financial Resource Strain: Low Risk    Difficulty of Paying Living Expenses: Not hard at all  Food Insecurity: No Food Insecurity   Worried About Charity fundraiser in the Last Year: Never true   [place] in the Last Year: Never true  Transportation Needs: No Transportation Needs   Lack of Transportation (Medical): No   Lack of Transportation (Non-Medical): No  Physical Activity: Insufficiently Active   Days of Exercise per Week: 7 days   Minutes of Exercise per Session: 20 min  Stress: No Stress Concern Present   Feeling of Stress : Not at all  Social Connections: Socially Integrated   Frequency of Communication with Friends and Family: More than three times a week   Frequency of Social Gatherings with Friends and Family: More than three times a week   Attends Religious Services: 1 to 4 times per year   Active Member of [employer] or Organizations: Yes   Attends Archivist Meetings: More than 4 times per year   Marital Status: Married    Activities of Daily Living    01/15/2022     2:58 PM  In your present state of health, do you have any difficulty performing the following activities:  Hearing? 0  Vision? 0  Difficulty concentrating or making decisions? 0  Walking or climbing stairs? 0  Dressing or bathing? 0  Doing errands, shopping? 0  Preparing Food and eating ? N  Using the Toilet? N  In the past six months, have you accidently leaked urine? N  Do you have problems  with loss of bowel control? N  Managing your Medications? N  Managing your Finances? N  Housekeeping or managing your Housekeeping? N    Patient Education/ Literacy How often do you need to have someone help you when you read instructions, pamphlets, or other written materials from your doctor or pharmacy?: 1 - Never What is the last grade level you completed in school?: 12th grade  Exercise Current Exercise Habits: Home exercise routine, Type of exercise: walking, Time (Minutes): 20, Frequency (Times/Week): 7, Weekly Exercise (Minutes/Week): 140, Intensity: Moderate, Exercise limited by: None identified  Diet Patient reports consuming 3 meals a day and 1 snack(s) a day Patient reports that her primary diet is: Regular Patient reports that she does have regular access to food.   Depression Screen    01/15/2022    3:01 PM 12/29/2020    1:22 PM 11/03/2020    9:27 AM 11/03/2019    8:33 AM 08/03/2019    9:20 AM 07/29/2018    9:10 AM 01/28/2018    9:48 AM  PHQ 2/9 Scores  PHQ - 2 Score 0 '3 1 2 '$ 0 0 1  PHQ- 9 Score  '5 3 4        '$ Fall Risk    01/15/2022    3:01 PM 12/29/2020    1:22 PM 11/03/2020    9:27 AM 11/03/2019    8:34 AM 08/03/2019    9:20 AM  [place] in the past year? 0 0 0 1 0  Number falls in past yr: 0 0 0 1 0  Injury with Fall? 0 0 0 0 0  Risk for fall due to : No Fall Risks No Fall Risks  History of fall(s)   Follow up Falls evaluation completed Falls evaluation completed Falls evaluation completed Falls evaluation completed Falls prevention discussed      Objective:  Lillion Elbert seemed alert and oriented and she participated appropriately during our telephone visit.  Blood Pressure Weight BMI  BP Readings from Last 3 Encounters:  11/03/20 (!) 152/76  04/17/20 115/65  01/31/20 136/84   Wt Readings from Last 3 Encounters:  01/15/22 174 lb (78.9 kg)  11/03/20 176 lb (79.8 kg)  04/17/20 174 lb (78.9 kg)   BMI Readings from Last 1 Encounters:  01/15/22 28.96 kg/m    *Unable to obtain current vital signs, weight, and BMI due to telephone visit type  Hearing/Vision  Shamyah did not seem to have difficulty with hearing/understanding during the telephone conversation Reports that she has had a formal eye exam by an eye care professional within the past year Reports that she has not had a formal hearing evaluation within the past year *Unable to fully assess hearing and vision during telephone visit type  Cognitive Function:    01/15/2022    3:05 PM 12/29/2020    1:34 PM 11/03/2019    8:11 AM 08/03/2019    9:23 AM 07/29/2018    9:17 AM  6CIT Screen  What Year? 0 points 0 points 0 points 0 points 0 points  What month? 0 points 0 points 0 points 0 points 0 points  What time? 0 points 0 points 0 points 0 points 0 points  Count back from 20 0 points 0 points 0 points 0 points 0 points  Months in reverse 0 points 0 points 0 points 0 points 0 points  Repeat phrase 0 points 0 points 2 points 2 points 0 points  Total Score 0 points 0 points 2  points 2 points 0 points   (Normal:0-7, Significant for Dysfunction: >8)  Normal Cognitive Function Screening: Yes   Immunization & Health Maintenance Record Immunization History  Administered Date(s) Administered   Fluad Quad(high Dose 65+) 04/06/2019, 06/01/2020, 05/17/2021   Influenza, High Dose Seasonal PF 05/26/2018   Influenza,inj,Quad PF,6+ Mos 06/23/2017, 06/24/2017   PFIZER(Purple Top)SARS-COV-2 Vaccination 09/16/2019, 10/14/2019, 05/17/2020   Pneumococcal Conjugate-13 11/25/2016    Pneumococcal Polysaccharide-23 01/28/2018   Td 05/18/2009   Tdap 03/20/2019   Zoster, Live 10/15/2011    Health Maintenance  Topic Date Due   COVID-19 Vaccine (4 - Booster for Pfizer series) 01/31/2022 (Originally 07/12/2020)   Zoster Vaccines- Shingrix (1 of 2) 04/17/2022 (Originally 08/23/1969)   INFLUENZA VACCINE  03/19/2022   MAMMOGRAM  03/30/2023   COLONOSCOPY (Pts 45-50yr Insurance coverage will need to be confirmed)  12/20/2026   TETANUS/TDAP  03/19/2029   Pneumonia Vaccine 71 Years old  Completed   DEXA SCAN  Completed   Hepatitis C Screening  Completed   HPV VACCINES  Aged Out       Assessment  This is a routine wellness examination for [employer]  Health Maintenance: Due or Overdue There are no preventive care reminders to display for this patient.   CBiagio Borgdoes not need a referral for Community Assistance: Care Management:   no Social Work:    no Prescription Assistance:  no Nutrition/Diabetes Education:  no   Plan:  Personalized Goals  Goals Addressed             This Visit's Progress    Patient Stated       Would like to loose 5-10 lbs.       Personalized Health Maintenance & Screening Recommendations  Shingrix  Lung Cancer Screening Recommended: yes; she has a referral already (Low Dose CT Chest recommended if Age 71-80years, 30 pack-year currently smoking OR have quit w/in past 15 years) Hepatitis C Screening recommended: no HIV Screening recommended: no  Advanced Directives: Written information was not prepared per patient's request.  Referrals & Orders No orders of the defined types were placed in this encounter.   Follow-up Plan Follow-up with BDonella Stade PA-C as planned Schedule your Shingrix vaccine at your pharmacy. Medicare wellness visit in one year. Patient will access AVS on my chart.   I have personally reviewed and noted the following in the patient's chart:   Medical and social history Use of  alcohol, tobacco or illicit drugs  Current medications and supplements Functional ability and status Nutritional status Physical activity Advanced directives List of other physicians Hospitalizations, surgeries, and ER visits in previous 12 months Vitals Screenings to include cognitive, depression, and falls Referrals and appointments  In addition, I have reviewed and discussed with CBiagio Borgcertain preventive protocols, quality metrics, and best practice recommendations. A written personalized care plan for preventive services as well as general preventive health recommendations is available and can be mailed to the patient at her request.      BTinnie Gens RN  01/15/2022

## 2022-01-15 NOTE — Patient Instructions (Addendum)
[place] Maintenance Summary and Written Plan of Care  Ms. Fogelman ,  Thank you for allowing me to perform your Medicare Annual Wellness Visit and for your ongoing commitment to your health.   Health Maintenance & Immunization History Health Maintenance  Topic Date Due  . COVID-19 Vaccine (4 - Booster for [place] series) 01/31/2022 (Originally 07/12/2020)  . Zoster Vaccines- Shingrix (1 of 2) 04/17/2022 (Originally 08/23/1969)  . INFLUENZA VACCINE  03/19/2022  . MAMMOGRAM  03/30/2023  . COLONOSCOPY (Pts 45-48yr Insurance coverage will need to be confirmed)  12/20/2026  . TETANUS/TDAP  03/19/2029  . Pneumonia Vaccine 71 Years old  Completed  . DEXA SCAN  Completed  . Hepatitis C Screening  Completed  . HPV VACCINES  Aged Out   Immunization History  Administered Date(s) Administered  . Fluad Quad(high Dose 65+) 04/06/2019, 06/01/2020, 05/17/2021  . Influenza, High Dose Seasonal PF 05/26/2018  . Influenza,inj,Quad PF,6+ Mos 06/23/2017, 06/24/2017  . PFIZER(Purple Top)SARS-COV-2 Vaccination 09/16/2019, 10/14/2019, 05/17/2020  . Pneumococcal Conjugate-13 11/25/2016  . Pneumococcal Polysaccharide-23 01/28/2018  . Td 05/18/2009  . Tdap 03/20/2019  . Zoster, Live 10/15/2011    These are the patient goals that we discussed:  Goals Addressed            This Visit's Progress   . Patient Stated       Would like to loose 5-10 lbs.        This is a list of Health Maintenance Items that are overdue or due now: Shingrix  Orders/Referrals Placed Today: No orders of the defined types were placed in this encounter.  (Contact our referral department at [phone] you have not spoken with someone about your referral appointment within the next 5 days)    Follow-up Plan Follow-up with BDonella Stade PA-C as planned Schedule your Shingrix vaccine at your pharmacy. Medicare wellness visit in one year. Patient will access AVS on my  chart.      Health Maintenance, Female Adopting a healthy lifestyle and getting preventive care are important in promoting health and wellness. Ask your health care provider about: The right schedule for you to have regular tests and exams. Things you can do on your own to prevent diseases and keep yourself healthy. What should I know about diet, weight, and exercise? Eat a healthy diet  Eat a diet that includes plenty of vegetables, fruits, low-fat dairy products, and lean protein. Do not eat a lot of foods that are high in solid fats, added sugars, or sodium. Maintain a healthy weight Body mass index (BMI) is used to identify weight problems. It estimates body fat based on height and weight. Your health care provider can help determine your BMI and help you achieve or maintain a healthy weight. Get regular exercise Get regular exercise. This is one of the most important things you can do for your health. Most adults should: Exercise for at least 150 minutes each week. The exercise should increase your heart rate and make you sweat (moderate-intensity exercise). Do strengthening exercises at least twice a week. This is in addition to the moderate-intensity exercise. Spend less time sitting. Even light physical activity can be beneficial. Watch cholesterol and blood lipids Have your blood tested for lipids and cholesterol at 71years of age, then have this test every 5 years. Have your cholesterol levels checked more often if: Your lipid or cholesterol levels are high. You are older than 71years of age. You are at high risk  for heart disease. What should I know about cancer screening? Depending on your health history and family history, you may need to have cancer screening at various ages. This may include screening for: Breast cancer. Cervical cancer. Colorectal cancer. Skin cancer. Lung cancer. What should I know about heart disease, diabetes, and high blood pressure? Blood  pressure and heart disease High blood pressure causes heart disease and increases the risk of stroke. This is more likely to develop in people who have high blood pressure readings or are overweight. Have your blood pressure checked: Every 3-5 years if you are 71-37 years of age. Every year if you are 75 years old or older. Diabetes Have regular diabetes screenings. This checks your fasting blood sugar level. Have the screening done: Once every three years after age 45 if you are at a normal weight and have a low risk for diabetes. More often and at a younger age if you are overweight or have a high risk for diabetes. What should I know about preventing infection? Hepatitis B If you have a higher risk for hepatitis B, you should be screened for this virus. Talk with your health care provider to find out if you are at risk for hepatitis B infection. Hepatitis C Testing is recommended for: Everyone born from [birth year] through 1965. Anyone with known risk factors for hepatitis C. Sexually transmitted infections (STIs) Get screened for STIs, including gonorrhea and chlamydia, if: You are sexually active and are younger than 71 years of age. You are older than 71 years of age and your health care provider tells you that you are at risk for this type of infection. Your sexual activity has changed since you were last screened, and you are at increased risk for chlamydia or gonorrhea. Ask your health care provider if you are at risk. Ask your health care provider about whether you are at high risk for HIV. Your health care provider may recommend a prescription medicine to help prevent HIV infection. If you choose to take medicine to prevent HIV, you should first get tested for HIV. You should then be tested every 3 months for as long as you are taking the medicine. Pregnancy If you are about to stop having your period (premenopausal) and you may become pregnant, seek counseling before you get  pregnant. Take 400 to 800 micrograms (mcg) of folic acid every day if you become pregnant. Ask for birth control (contraception) if you want to prevent pregnancy. Osteoporosis and menopause Osteoporosis is a disease in which the bones lose minerals and strength with aging. This can result in bone fractures. If you are 12 years old or older, or if you are at risk for osteoporosis and fractures, ask your health care provider if you should: Be screened for bone loss. Take a calcium or vitamin D supplement to lower your risk of fractures. Be given hormone replacement therapy (HRT) to treat symptoms of menopause. Follow these instructions at home: Alcohol use Do not drink alcohol if: Your health care provider tells you not to drink. You are pregnant, may be pregnant, or are planning to become pregnant. If you drink alcohol: Limit how much you have to: 0-1 drink a day. Know how much alcohol is in your drink. In the [place] one drink equals one 12 oz bottle of beer (355 mL), one 5 oz glass of wine (148 mL), or one 1 oz glass of hard liquor (44 mL). Lifestyle Do not use any products that contain nicotine or tobacco. These  products include cigarettes, chewing tobacco, and vaping devices, such as e-cigarettes. If you need help quitting, ask your health care provider. Do not use street drugs. Do not share needles. Ask your health care provider for help if you need support or information about quitting drugs. General instructions Schedule regular health, dental, and eye exams. Stay current with your vaccines. Tell your health care provider if: You often feel depressed. You have ever been abused or do not feel safe at home. Summary Adopting a healthy lifestyle and getting preventive care are important in promoting health and wellness. Follow your health care provider's instructions about healthy diet, exercising, and getting tested or screened for diseases. Follow your health care provider's  instructions on monitoring your cholesterol and blood pressure. This information is not intended to replace advice given to you by your health care provider. Make sure you discuss any questions you have with your health care provider. Document Revised: 12/25/2020 Document Reviewed: 12/25/2020 Elsevier Patient Education  [place].

## 2022-03-29 ENCOUNTER — Ambulatory Visit (INDEPENDENT_AMBULATORY_CARE_PROVIDER_SITE_OTHER): Payer: Medicare Other

## 2022-03-29 DIAGNOSIS — Z87891 Personal history of nicotine dependence: Secondary | ICD-10-CM

## 2022-03-29 DIAGNOSIS — F17210 Nicotine dependence, cigarettes, uncomplicated: Secondary | ICD-10-CM

## 2022-03-29 DIAGNOSIS — Z122 Encounter for screening for malignant neoplasm of respiratory organs: Secondary | ICD-10-CM

## 2022-04-01 ENCOUNTER — Other Ambulatory Visit: Payer: Self-pay | Admitting: Acute Care

## 2022-04-01 DIAGNOSIS — Z122 Encounter for screening for malignant neoplasm of respiratory organs: Secondary | ICD-10-CM

## 2022-04-01 DIAGNOSIS — F17210 Nicotine dependence, cigarettes, uncomplicated: Secondary | ICD-10-CM

## 2022-04-01 DIAGNOSIS — Z87891 Personal history of nicotine dependence: Secondary | ICD-10-CM

## 2022-04-10 ENCOUNTER — Ambulatory Visit (INDEPENDENT_AMBULATORY_CARE_PROVIDER_SITE_OTHER): Payer: Medicare Other | Admitting: Physician Assistant

## 2022-04-10 ENCOUNTER — Other Ambulatory Visit: Payer: Self-pay | Admitting: Physician Assistant

## 2022-04-10 ENCOUNTER — Encounter: Payer: Self-pay | Admitting: Physician Assistant

## 2022-04-10 VITALS — BP 138/70 | HR 85 | Ht 65.0 in | Wt 178.0 lb

## 2022-04-10 DIAGNOSIS — Z1231 Encounter for screening mammogram for malignant neoplasm of breast: Secondary | ICD-10-CM

## 2022-04-10 DIAGNOSIS — I10 Essential (primary) hypertension: Secondary | ICD-10-CM

## 2022-04-10 DIAGNOSIS — F17200 Nicotine dependence, unspecified, uncomplicated: Secondary | ICD-10-CM | POA: Diagnosis not present

## 2022-04-10 DIAGNOSIS — I700 Atherosclerosis of aorta: Secondary | ICD-10-CM | POA: Diagnosis not present

## 2022-04-10 DIAGNOSIS — J432 Centrilobular emphysema: Secondary | ICD-10-CM | POA: Diagnosis not present

## 2022-04-10 DIAGNOSIS — E782 Mixed hyperlipidemia: Secondary | ICD-10-CM | POA: Diagnosis not present

## 2022-04-10 DIAGNOSIS — I2510 Atherosclerotic heart disease of native coronary artery without angina pectoris: Secondary | ICD-10-CM

## 2022-04-10 MED ORDER — ATORVASTATIN CALCIUM 40 MG PO TABS: ORAL_TABLET | ORAL | 3 refills | Status: DC

## 2022-04-10 MED ORDER — TRELEGY ELLIPTA 100-62.5-25 MCG/ACT IN AEPB: 1.0000 | INHALATION_SPRAY | Freq: Every day | RESPIRATORY_TRACT | 11 refills | Status: DC

## 2022-04-10 NOTE — Progress Notes (Signed)
Established Patient Office Visit  Subjective   Patient ID: Olivia Frank, female    DOB: 07-19-1951  Age: 71 y.o. MRN: [id number]  Chief Complaint  Patient presents with   Follow-up    HPI Pt is a 71 yo female with COPD, HTN, HLD, elevated TG who presents to the clinic for follow up.   She would like to discuss CT annual screening results  IMPRESSION: 1. Lung-RADS 2, benign appearance or behavior. Continue annual screening with low-dose chest CT without contrast in 12 months. 2. Trace pericardial effusion. 3. Aortic Atherosclerosis (ICD10-I70.0) and Emphysema (ICD10-J43.9). 4. Coronary artery calcifications.  Pt denies any CP, palpitations, headaches, vision changes, dizziness. She claims she is not short of breath and walks her dogs daily. Her pulse ox at home is 93-96 percent. Not using any inhalers.   .. Active Ambulatory Problems    Diagnosis Date Noted   Melanoma of skin ([place]) 04/20/2009   SINUSITIS 09/22/2009   Postmenopausal 05/18/2009   CHRONIC OBSTRUCTIVE PULMONARY DISEASE, ACUTE 10/04/2010   Low back pain 04/14/2012   Fracture of radial head, right, closed 10/08/2013   Left wrist sprain 10/08/2013   Primary osteoarthritis of both knees 11/29/2014   Stress at home 05/17/2015   Cerumen impaction 05/17/2015   Hyperlipidemia 06/06/2015   Hypertriglyceridemia 06/06/2015   Cervical spondylosis with radiculopathy 11/06/2015   Basal cell carcinoma of left lower leg 03/22/2016   Hyperplastic colon polyp 12/23/2016   Essential hypertension 12/23/2016   Pulmonary nodule, right 12/26/2016   Emphysema lung ([place]) 12/26/2016   Coronary atherosclerosis 12/26/2016   Aortic atherosclerosis ([place]) 12/26/2016   Tobacco dependence 11/03/2019   Hoarse voice quality 11/03/2019   Current smoker 11/03/2019   Newly recognized heart murmur 11/03/2019   Elevated BP without diagnosis of hypertension 11/03/2020   Encounter for screening for lung cancer 12/29/2020   Lateral  epicondylitis, left elbow 10/03/2021   Resolved Ambulatory Problems    Diagnosis Date Noted   HYPERLIPIDEMIA 05/18/2009   EUSTACHIAN TUBE DYSFUNCTION, RIGHT 04/20/2009   MAMMOGRAM, ABNORMAL 07/18/2009   Pneumonia, organism unspecified(486) 09/20/2010   Felon 05/27/2011   Elevated blood pressure 05/17/2015   Bilateral impacted cerumen 01/28/2018   Toe pain, right 11/03/2019   Past Medical History:  Diagnosis Date   COPD (chronic obstructive pulmonary disease) ([place])    Melanoma ([place])    Seasonal allergies      ROS See HPI.    Objective:     BP 138/70   Pulse 85   Ht '5\' 5"'$  (1.651 m)   Wt 178 lb (80.7 kg)   SpO2 93%   BMI 29.62 kg/m  BP Readings from Last 3 Encounters:  04/10/22 138/70  11/03/20 (!) 152/76  04/17/20 115/65   Wt Readings from Last 3 Encounters:  04/10/22 178 lb (80.7 kg)  01/15/22 174 lb (78.9 kg)  11/03/20 176 lb (79.8 kg)      Physical Exam Constitutional:      Appearance: Normal appearance.  HENT:     Head: Normocephalic.  Neck:     Vascular: No carotid bruit.  Cardiovascular:     Rate and Rhythm: Normal rate and regular rhythm.     Pulses: Normal pulses.  Pulmonary:     Effort: Pulmonary effort is normal.     Breath sounds: Normal breath sounds.  Musculoskeletal:     Cervical back: Normal range of motion and neck supple. No rigidity or tenderness.     Right lower leg: No edema.  Left lower leg: No edema.  Lymphadenopathy:     Cervical: No cervical adenopathy.  Neurological:     General: No focal deficit present.     Mental Status: She is alert and oriented to person, place, and time.  Psychiatric:        Mood and Affect: Mood normal.          Assessment & Plan:  Marland KitchenMarland KitchenSherika was seen today for follow-up.  Diagnoses and all orders for this visit:  Centrilobular emphysema ([place]) -     Fluticasone-Umeclidin-Vilant (TRELEGY ELLIPTA) 100-62.5-25 MCG/ACT AEPB; Inhale 1 puff into the lungs daily.  Mixed hyperlipidemia -      atorvastatin (LIPITOR) 40 MG tablet; Take 1 tablet by mouth once daily.  Current smoker  Essential hypertension  Aortic atherosclerosis ([facility]) -     atorvastatin (LIPITOR) 40 MG tablet; Take 1 tablet by mouth once daily.  Atherosclerosis of native coronary artery of native heart without angina pectoris -     atorvastatin (LIPITOR) 40 MG tablet; Take 1 tablet by mouth once daily.   BP much better on 2nd recheck Reviewed CT low dose annual screening: discussed emphysema stable with stable atherosclerosis/calcification of coronary arteries.  Start ASA '81mg'$  with lipitor today for CV risk reduction TG elevated at last visit make sure taking fish oil daily Pulse ox 93 percent today- start trelegy once daily.  Encouraged smoking cessation.  Encouraged high dose flu vaccine and  RSV vaccine for this season.   Return in about 6 months (around 10/11/2022).    [name], PA-C

## 2022-04-25 ENCOUNTER — Ambulatory Visit (INDEPENDENT_AMBULATORY_CARE_PROVIDER_SITE_OTHER): Payer: Medicare Other

## 2022-04-25 DIAGNOSIS — Z1231 Encounter for screening mammogram for malignant neoplasm of breast: Secondary | ICD-10-CM

## 2022-04-26 NOTE — Progress Notes (Signed)
Normal mammogram. Follow up in 1 year.

## 2022-05-01 DIAGNOSIS — H25813 Combined forms of age-related cataract, bilateral: Secondary | ICD-10-CM | POA: Diagnosis not present

## 2022-05-01 DIAGNOSIS — H40013 Open angle with borderline findings, low risk, bilateral: Secondary | ICD-10-CM | POA: Diagnosis not present

## 2022-05-01 DIAGNOSIS — H524 Presbyopia: Secondary | ICD-10-CM | POA: Diagnosis not present

## 2022-05-01 DIAGNOSIS — H35373 Puckering of macula, bilateral: Secondary | ICD-10-CM | POA: Diagnosis not present

## 2022-05-01 DIAGNOSIS — H43813 Vitreous degeneration, bilateral: Secondary | ICD-10-CM | POA: Diagnosis not present

## 2022-05-07 DIAGNOSIS — D0362 Melanoma in situ of left upper limb, including shoulder: Secondary | ICD-10-CM | POA: Diagnosis not present

## 2022-05-07 DIAGNOSIS — L821 Other seborrheic keratosis: Secondary | ICD-10-CM | POA: Diagnosis not present

## 2022-05-07 DIAGNOSIS — Z872 Personal history of diseases of the skin and subcutaneous tissue: Secondary | ICD-10-CM | POA: Diagnosis not present

## 2022-05-07 DIAGNOSIS — D225 Melanocytic nevi of trunk: Secondary | ICD-10-CM | POA: Diagnosis not present

## 2022-05-07 DIAGNOSIS — L820 Inflamed seborrheic keratosis: Secondary | ICD-10-CM | POA: Diagnosis not present

## 2022-05-07 DIAGNOSIS — Z08 Encounter for follow-up examination after completed treatment for malignant neoplasm: Secondary | ICD-10-CM | POA: Diagnosis not present

## 2022-05-07 DIAGNOSIS — Z85820 Personal history of malignant melanoma of skin: Secondary | ICD-10-CM | POA: Diagnosis not present

## 2022-05-07 DIAGNOSIS — L538 Other specified erythematous conditions: Secondary | ICD-10-CM | POA: Diagnosis not present

## 2022-05-07 DIAGNOSIS — D485 Neoplasm of uncertain behavior of skin: Secondary | ICD-10-CM | POA: Diagnosis not present

## 2022-05-07 DIAGNOSIS — Z85828 Personal history of other malignant neoplasm of skin: Secondary | ICD-10-CM | POA: Diagnosis not present

## 2022-05-07 DIAGNOSIS — L814 Other melanin hyperpigmentation: Secondary | ICD-10-CM | POA: Diagnosis not present

## 2022-05-07 DIAGNOSIS — L728 Other follicular cysts of the skin and subcutaneous tissue: Secondary | ICD-10-CM | POA: Diagnosis not present

## 2022-05-07 DIAGNOSIS — C4359 Malignant melanoma of other part of trunk: Secondary | ICD-10-CM | POA: Diagnosis not present

## 2022-05-07 DIAGNOSIS — D0359 Melanoma in situ of other part of trunk: Secondary | ICD-10-CM | POA: Diagnosis not present

## 2022-05-10 DIAGNOSIS — D485 Neoplasm of uncertain behavior of skin: Secondary | ICD-10-CM | POA: Diagnosis not present

## 2022-05-10 DIAGNOSIS — R2230 Localized swelling, mass and lump, unspecified upper limb: Secondary | ICD-10-CM | POA: Diagnosis not present

## 2022-05-22 DIAGNOSIS — R2232 Localized swelling, mass and lump, left upper limb: Secondary | ICD-10-CM | POA: Diagnosis not present

## 2022-05-30 DIAGNOSIS — L989 Disorder of the skin and subcutaneous tissue, unspecified: Secondary | ICD-10-CM | POA: Diagnosis not present

## 2022-05-30 DIAGNOSIS — C4362 Malignant melanoma of left upper limb, including shoulder: Secondary | ICD-10-CM | POA: Diagnosis not present

## 2022-05-30 DIAGNOSIS — C4359 Malignant melanoma of other part of trunk: Secondary | ICD-10-CM | POA: Diagnosis not present

## 2022-06-13 ENCOUNTER — Ambulatory Visit (INDEPENDENT_AMBULATORY_CARE_PROVIDER_SITE_OTHER): Payer: Medicare Other | Admitting: Family Medicine

## 2022-06-13 VITALS — Temp 98.0°F

## 2022-06-13 DIAGNOSIS — Z23 Encounter for immunization: Secondary | ICD-10-CM | POA: Diagnosis not present

## 2022-06-13 NOTE — Progress Notes (Signed)
Pt here for flu shot. Afebrile,no recent illness. Vaccination given, pt tolerated well.

## 2022-06-19 DIAGNOSIS — R2232 Localized swelling, mass and lump, left upper limb: Secondary | ICD-10-CM | POA: Diagnosis not present

## 2022-07-11 DIAGNOSIS — S82891A Other fracture of right lower leg, initial encounter for closed fracture: Secondary | ICD-10-CM | POA: Diagnosis not present

## 2022-07-11 DIAGNOSIS — Z79899 Other long term (current) drug therapy: Secondary | ICD-10-CM | POA: Diagnosis not present

## 2022-07-11 DIAGNOSIS — Z029 Encounter for administrative examinations, unspecified: Secondary | ICD-10-CM | POA: Diagnosis not present

## 2022-07-11 DIAGNOSIS — M25571 Pain in right ankle and joints of right foot: Secondary | ICD-10-CM | POA: Diagnosis not present

## 2022-07-11 DIAGNOSIS — S82851A Displaced trimalleolar fracture of right lower leg, initial encounter for closed fracture: Secondary | ICD-10-CM | POA: Diagnosis not present

## 2022-07-11 DIAGNOSIS — R0600 Dyspnea, unspecified: Secondary | ICD-10-CM | POA: Diagnosis not present

## 2022-07-11 DIAGNOSIS — F17210 Nicotine dependence, cigarettes, uncomplicated: Secondary | ICD-10-CM | POA: Diagnosis not present

## 2022-07-11 DIAGNOSIS — S82841A Displaced bimalleolar fracture of right lower leg, initial encounter for closed fracture: Secondary | ICD-10-CM | POA: Diagnosis not present

## 2022-07-11 DIAGNOSIS — E785 Hyperlipidemia, unspecified: Secondary | ICD-10-CM | POA: Diagnosis not present

## 2022-07-15 ENCOUNTER — Telehealth: Payer: Self-pay | Admitting: General Practice
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
## 2022-07-17 DIAGNOSIS — M25571 Pain in right ankle and joints of right foot: Secondary | ICD-10-CM | POA: Diagnosis not present

## 2022-07-30 ENCOUNTER — Other Ambulatory Visit: Payer: Self-pay | Admitting: Physician Assistant

## 2022-07-30 DIAGNOSIS — M25571 Pain in right ankle and joints of right foot: Secondary | ICD-10-CM | POA: Diagnosis not present

## 2022-08-20 DIAGNOSIS — M25571 Pain in right ankle and joints of right foot: Secondary | ICD-10-CM | POA: Diagnosis not present

## 2022-08-20 DIAGNOSIS — Z8781 Personal history of (healed) traumatic fracture: Secondary | ICD-10-CM | POA: Diagnosis not present

## 2022-08-20 DIAGNOSIS — Z4889 Encounter for other specified surgical aftercare: Secondary | ICD-10-CM | POA: Diagnosis not present

## 2022-09-02 DIAGNOSIS — Z8781 Personal history of (healed) traumatic fracture: Secondary | ICD-10-CM | POA: Diagnosis not present

## 2022-09-02 DIAGNOSIS — M25571 Pain in right ankle and joints of right foot: Secondary | ICD-10-CM | POA: Diagnosis not present

## 2022-09-02 DIAGNOSIS — Z4889 Encounter for other specified surgical aftercare: Secondary | ICD-10-CM | POA: Diagnosis not present

## 2022-09-10 DIAGNOSIS — Z8781 Personal history of (healed) traumatic fracture: Secondary | ICD-10-CM | POA: Diagnosis not present

## 2022-09-10 DIAGNOSIS — Z4889 Encounter for other specified surgical aftercare: Secondary | ICD-10-CM | POA: Diagnosis not present

## 2022-09-10 DIAGNOSIS — M25571 Pain in right ankle and joints of right foot: Secondary | ICD-10-CM | POA: Diagnosis not present

## 2022-09-16 DIAGNOSIS — M25571 Pain in right ankle and joints of right foot: Secondary | ICD-10-CM | POA: Diagnosis not present

## 2022-09-16 DIAGNOSIS — Z8781 Personal history of (healed) traumatic fracture: Secondary | ICD-10-CM | POA: Diagnosis not present

## 2022-09-16 DIAGNOSIS — Z4889 Encounter for other specified surgical aftercare: Secondary | ICD-10-CM | POA: Diagnosis not present

## 2022-09-24 DIAGNOSIS — M25571 Pain in right ankle and joints of right foot: Secondary | ICD-10-CM | POA: Diagnosis not present

## 2022-09-24 DIAGNOSIS — Z4889 Encounter for other specified surgical aftercare: Secondary | ICD-10-CM | POA: Diagnosis not present

## 2022-09-24 DIAGNOSIS — Z8781 Personal history of (healed) traumatic fracture: Secondary | ICD-10-CM | POA: Diagnosis not present

## 2022-09-27 DIAGNOSIS — Z4889 Encounter for other specified surgical aftercare: Secondary | ICD-10-CM | POA: Diagnosis not present

## 2022-09-27 DIAGNOSIS — Z8781 Personal history of (healed) traumatic fracture: Secondary | ICD-10-CM | POA: Diagnosis not present

## 2022-09-27 DIAGNOSIS — M25571 Pain in right ankle and joints of right foot: Secondary | ICD-10-CM | POA: Diagnosis not present

## 2022-09-30 DIAGNOSIS — Z8781 Personal history of (healed) traumatic fracture: Secondary | ICD-10-CM | POA: Diagnosis not present

## 2022-09-30 DIAGNOSIS — Z4889 Encounter for other specified surgical aftercare: Secondary | ICD-10-CM | POA: Diagnosis not present

## 2022-09-30 DIAGNOSIS — M25571 Pain in right ankle and joints of right foot: Secondary | ICD-10-CM | POA: Diagnosis not present

## 2022-10-01 DIAGNOSIS — M25571 Pain in right ankle and joints of right foot: Secondary | ICD-10-CM | POA: Diagnosis not present

## 2022-10-01 DIAGNOSIS — Z8781 Personal history of (healed) traumatic fracture: Secondary | ICD-10-CM | POA: Diagnosis not present

## 2022-10-01 DIAGNOSIS — Z4889 Encounter for other specified surgical aftercare: Secondary | ICD-10-CM | POA: Diagnosis not present

## 2022-10-04 DIAGNOSIS — Z4889 Encounter for other specified surgical aftercare: Secondary | ICD-10-CM | POA: Diagnosis not present

## 2022-10-04 DIAGNOSIS — Z8781 Personal history of (healed) traumatic fracture: Secondary | ICD-10-CM | POA: Diagnosis not present

## 2022-10-04 DIAGNOSIS — M25571 Pain in right ankle and joints of right foot: Secondary | ICD-10-CM | POA: Diagnosis not present

## 2022-10-07 DIAGNOSIS — M25571 Pain in right ankle and joints of right foot: Secondary | ICD-10-CM | POA: Diagnosis not present

## 2022-10-07 DIAGNOSIS — Z4889 Encounter for other specified surgical aftercare: Secondary | ICD-10-CM | POA: Diagnosis not present

## 2022-10-07 DIAGNOSIS — Z8781 Personal history of (healed) traumatic fracture: Secondary | ICD-10-CM | POA: Diagnosis not present

## 2022-10-11 ENCOUNTER — Encounter: Payer: Self-pay | Admitting: Physician Assistant

## 2022-10-11 ENCOUNTER — Ambulatory Visit (INDEPENDENT_AMBULATORY_CARE_PROVIDER_SITE_OTHER): Payer: Medicare Other | Admitting: Physician Assistant

## 2022-10-11 VITALS — BP 130/74 | HR 81 | Ht 65.0 in | Wt 173.0 lb

## 2022-10-11 DIAGNOSIS — E781 Pure hyperglyceridemia: Secondary | ICD-10-CM

## 2022-10-11 DIAGNOSIS — I10 Essential (primary) hypertension: Secondary | ICD-10-CM

## 2022-10-11 DIAGNOSIS — Z1329 Encounter for screening for other suspected endocrine disorder: Secondary | ICD-10-CM | POA: Diagnosis not present

## 2022-10-11 DIAGNOSIS — Z13820 Encounter for screening for osteoporosis: Secondary | ICD-10-CM

## 2022-10-11 DIAGNOSIS — Z0000 Encounter for general adult medical examination without abnormal findings: Secondary | ICD-10-CM

## 2022-10-11 DIAGNOSIS — Z780 Asymptomatic menopausal state: Secondary | ICD-10-CM
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
## 2022-10-12 LAB — COMPLETE METABOLIC PANEL WITH GFR
AG Ratio: 1.8 (calc) (ref 1.0–2.5)
ALT: 28 U/L (ref 6–29)
AST: 19 U/L (ref 10–35)
Albumin: 4.3 g/dL (ref 3.6–5.1)
Alkaline phosphatase (APISO): 115 U/L (ref 37–153)
BUN: 14 mg/dL (ref 7–25)
CO2: 27 mmol/L (ref 20–32)
Calcium: 9.5 mg/dL (ref 8.6–10.4)
Chloride: 104 mmol/L (ref 98–110)
Creat: 0.62 mg/dL (ref 0.60–1.00)
Globulin: 2.4 g/dL (calc) (ref 1.9–3.7)
Glucose, Bld: 74 mg/dL (ref 65–99)
Potassium: 4.4 mmol/L (ref 3.5–5.3)
Sodium: 142 mmol/L (ref 135–146)
Total Bilirubin: 0.4 mg/dL (ref 0.2–1.2)
Total Protein: 6.7 g/dL (ref 6.1–8.1)
eGFR: 95 mL/min/{1.73_m2} (ref >=60)

## 2022-10-12 LAB — TSH: TSH: 2.06 mIU/L (ref 0.40–4.50)

## 2022-10-12 LAB — LIPID PANEL W/REFLEX DIRECT LDL
Cholesterol: 163 mg/dL (ref <200)
HDL: 60 mg/dL (ref >=50)
LDL Cholesterol (Calc): 76 mg/dL (calc)
Non-HDL Cholesterol (Calc): 103 mg/dL (calc) (ref <130)
Total CHOL/HDL Ratio: 2.7 (calc) (ref <5.0)
Triglycerides: 178 mg/dL — ABNORMAL HIGH (ref <150)

## 2022-10-14 DIAGNOSIS — Z4889 Encounter for other specified surgical aftercare: Secondary | ICD-10-CM | POA: Diagnosis not present

## 2022-10-14 DIAGNOSIS — M25571 Pain in right ankle and joints of right foot: Secondary | ICD-10-CM | POA: Diagnosis not present

## 2022-10-14 DIAGNOSIS — Z8781 Personal history of (healed) traumatic fracture: Secondary | ICD-10-CM | POA: Diagnosis not present

## 2022-10-14 NOTE — Progress Notes (Signed)
Amarra,   Thyroid looks good.  Kidney, liver, glucose look great.  LDL to goal.  TG improving.  HDL is GREAT.

## 2022-10-16 ENCOUNTER — Other Ambulatory Visit: Payer: Medicare Other

## 2022-10-23 ENCOUNTER — Ambulatory Visit (INDEPENDENT_AMBULATORY_CARE_PROVIDER_SITE_OTHER): Payer: Medicare Other

## 2022-10-23 DIAGNOSIS — Z13820 Encounter for screening for osteoporosis: Secondary | ICD-10-CM

## 2022-10-23 DIAGNOSIS — M25571 Pain in right ankle and joints of right foot: Secondary | ICD-10-CM | POA: Diagnosis not present

## 2022-10-23 DIAGNOSIS — Z780 Asymptomatic menopausal state: Secondary | ICD-10-CM

## 2022-10-23 DIAGNOSIS — Z8781 Personal history of (healed) traumatic fracture: Secondary | ICD-10-CM | POA: Diagnosis not present

## 2022-10-23 DIAGNOSIS — Z4889 Encounter for other specified surgical aftercare: Secondary | ICD-10-CM | POA: Diagnosis not present

## 2022-10-23 NOTE — Progress Notes (Signed)
Bone density is normal! Continue on vitamin D and Calcium! Repeat in 5 years.

## 2022-10-25 DIAGNOSIS — Z8781 Personal history of (healed) traumatic fracture: Secondary | ICD-10-CM | POA: Diagnosis not present

## 2022-10-25 DIAGNOSIS — Z4889 Encounter for other specified surgical aftercare: Secondary | ICD-10-CM | POA: Diagnosis not present

## 2022-10-25 DIAGNOSIS — M25571 Pain in right ankle and joints of right foot: Secondary | ICD-10-CM | POA: Diagnosis not present

## 2022-10-28 DIAGNOSIS — M25571 Pain in right ankle and joints of right foot: Secondary | ICD-10-CM | POA: Diagnosis not present

## 2022-10-28 DIAGNOSIS — Z4889 Encounter for other specified surgical aftercare: Secondary | ICD-10-CM | POA: Diagnosis not present

## 2022-10-28 DIAGNOSIS — Z8781 Personal history of (healed) traumatic fracture: Secondary | ICD-10-CM | POA: Diagnosis not present

## 2022-10-31 DIAGNOSIS — M25571 Pain in right ankle and joints of right foot: Secondary | ICD-10-CM | POA: Diagnosis not present

## 2022-10-31 DIAGNOSIS — Z4889 Encounter for other specified surgical aftercare: Secondary | ICD-10-CM | POA: Diagnosis not present

## 2022-10-31 DIAGNOSIS — Z8781 Personal history of (healed) traumatic fracture: Secondary | ICD-10-CM | POA: Diagnosis not present

## 2022-11-04 DIAGNOSIS — Z4889 Encounter for other specified surgical aftercare: Secondary | ICD-10-CM | POA: Diagnosis not present

## 2022-11-04 DIAGNOSIS — Z8781 Personal history of (healed) traumatic fracture: Secondary | ICD-10-CM | POA: Diagnosis not present

## 2022-11-04 DIAGNOSIS — M25571 Pain in right ankle and joints of right foot: Secondary | ICD-10-CM | POA: Diagnosis not present

## 2022-11-07 DIAGNOSIS — Z4889 Encounter for other specified surgical aftercare: Secondary | ICD-10-CM | POA: Diagnosis not present

## 2022-11-07 DIAGNOSIS — M25571 Pain in right ankle and joints of right foot: Secondary | ICD-10-CM | POA: Diagnosis not present

## 2022-11-07 DIAGNOSIS — Z8781 Personal history of (healed) traumatic fracture: Secondary | ICD-10-CM | POA: Diagnosis not present

## 2022-11-19 DIAGNOSIS — D2271 Melanocytic nevi of right lower limb, including hip: Secondary | ICD-10-CM | POA: Diagnosis not present

## 2022-11-19 DIAGNOSIS — R233 Spontaneous ecchymoses: Secondary | ICD-10-CM | POA: Diagnosis not present

## 2022-11-19 DIAGNOSIS — D485 Neoplasm of uncertain behavior of skin: Secondary | ICD-10-CM | POA: Diagnosis not present

## 2022-11-25 DIAGNOSIS — Z4889 Encounter for other specified surgical aftercare: Secondary | ICD-10-CM | POA: Diagnosis not present

## 2022-11-25 DIAGNOSIS — M25571 Pain in right ankle and joints of right foot: Secondary | ICD-10-CM | POA: Diagnosis not present

## 2022-11-25 DIAGNOSIS — L0390 Cellulitis, unspecified: Secondary | ICD-10-CM | POA: Diagnosis not present

## 2022-11-25 DIAGNOSIS — Z8781 Personal history of (healed) traumatic fracture: Secondary | ICD-10-CM | POA: Diagnosis not present

## 2022-11-27 DIAGNOSIS — M19071 Primary osteoarthritis, right ankle and foot: Secondary | ICD-10-CM | POA: Diagnosis not present

## 2022-11-27 DIAGNOSIS — L0390 Cellulitis, unspecified: Secondary | ICD-10-CM | POA: Diagnosis not present

## 2022-11-27 DIAGNOSIS — M25571 Pain in right ankle and joints of right foot: Secondary | ICD-10-CM | POA: Diagnosis not present

## 2022-12-02 DIAGNOSIS — L089 Local infection of the skin and subcutaneous tissue, unspecified: Secondary | ICD-10-CM | POA: Diagnosis not present

## 2022-12-02 DIAGNOSIS — S90511D Abrasion, right ankle, subsequent encounter: Secondary | ICD-10-CM | POA: Diagnosis not present

## 2022-12-05 DIAGNOSIS — T8141XA Infection following a procedure, superficial incisional surgical site, initial encounter: Secondary | ICD-10-CM | POA: Diagnosis not present

## 2022-12-05 DIAGNOSIS — E785 Hyperlipidemia, unspecified: Secondary | ICD-10-CM | POA: Diagnosis not present

## 2022-12-05 DIAGNOSIS — F17210 Nicotine dependence, cigarettes, uncomplicated: Secondary | ICD-10-CM | POA: Diagnosis not present

## 2022-12-05 DIAGNOSIS — M25571 Pain in right ankle and joints of right foot: Secondary | ICD-10-CM | POA: Diagnosis not present

## 2022-12-05 DIAGNOSIS — Z885 Allergy status to narcotic agent status: Secondary | ICD-10-CM | POA: Diagnosis not present

## 2022-12-05 DIAGNOSIS — Z888 Allergy status to other drugs, medicaments and biological substances status: Secondary | ICD-10-CM | POA: Diagnosis not present

## 2022-12-05 DIAGNOSIS — Z472 Encounter for removal of internal fixation device: Secondary | ICD-10-CM | POA: Diagnosis not present

## 2022-12-05 DIAGNOSIS — J449 Chronic obstructive pulmonary disease, unspecified: Secondary | ICD-10-CM | POA: Diagnosis not present

## 2022-12-05 DIAGNOSIS — Z79899 Other long term (current) drug therapy: Secondary | ICD-10-CM | POA: Diagnosis not present

## 2022-12-05 DIAGNOSIS — L089 Local infection of the skin and subcutaneous tissue, unspecified: Secondary | ICD-10-CM | POA: Diagnosis not present

## 2022-12-05 DIAGNOSIS — S90511D Abrasion, right ankle, subsequent encounter: Secondary | ICD-10-CM | POA: Diagnosis not present

## 2022-12-05 DIAGNOSIS — T8142XA Infection following a procedure, deep incisional surgical site, initial encounter: Secondary | ICD-10-CM | POA: Diagnosis not present

## 2022-12-05 DIAGNOSIS — R011 Cardiac murmur, unspecified: Secondary | ICD-10-CM | POA: Diagnosis not present

## 2022-12-05 DIAGNOSIS — L03115 Cellulitis of right lower limb: Secondary | ICD-10-CM | POA: Diagnosis not present

## 2022-12-05 HISTORY — PX: ANKLE HARDWARE REMOVAL: SHX1149

## 2022-12-09 ENCOUNTER — Other Ambulatory Visit: Payer: Self-pay | Admitting: Physician Assistant

## 2022-12-09 DIAGNOSIS — M25571 Pain in right ankle and joints of right foot: Secondary | ICD-10-CM | POA: Diagnosis not present

## 2022-12-09 DIAGNOSIS — Z4889 Encounter for other specified surgical aftercare: Secondary | ICD-10-CM | POA: Diagnosis not present

## 2022-12-16 DIAGNOSIS — Z4889 Encounter for other specified surgical aftercare: Secondary | ICD-10-CM | POA: Diagnosis not present

## 2022-12-17 DIAGNOSIS — Z1330 Encounter for screening examination for mental health and behavioral disorders, unspecified: Secondary | ICD-10-CM | POA: Diagnosis not present

## 2022-12-17 DIAGNOSIS — T847XXA Infection and inflammatory reaction due to other internal orthopedic prosthetic devices, implants and grafts, initial encounter: Secondary | ICD-10-CM | POA: Diagnosis not present

## 2022-12-17 DIAGNOSIS — J449 Chronic obstructive pulmonary disease, unspecified: Secondary | ICD-10-CM | POA: Diagnosis not present

## 2022-12-17 DIAGNOSIS — E7800 Pure hypercholesterolemia, unspecified: Secondary | ICD-10-CM | POA: Diagnosis not present

## 2022-12-18 DIAGNOSIS — T847XXA Infection and inflammatory reaction due to other internal orthopedic prosthetic devices, implants and grafts, initial encounter: Secondary | ICD-10-CM | POA: Diagnosis not present

## 2022-12-25 DIAGNOSIS — T847XXA Infection and inflammatory reaction due to other internal orthopedic prosthetic devices, implants and grafts, initial encounter: Secondary | ICD-10-CM | POA: Diagnosis not present

## 2023-01-08 DIAGNOSIS — H6123 Impacted cerumen, bilateral: Secondary | ICD-10-CM | POA: Diagnosis not present

## 2023-01-08 DIAGNOSIS — M25571 Pain in right ankle and joints of right foot: Secondary | ICD-10-CM | POA: Diagnosis not present

## 2023-01-14 DIAGNOSIS — H35373 Puckering of macula, bilateral: Secondary | ICD-10-CM | POA: Diagnosis not present

## 2023-01-14 DIAGNOSIS — H40013 Open angle with borderline findings, low risk, bilateral: Secondary | ICD-10-CM | POA: Diagnosis not present

## 2023-01-14 DIAGNOSIS — H43813 Vitreous degeneration, bilateral: Secondary | ICD-10-CM | POA: Diagnosis not present

## 2023-01-14 DIAGNOSIS — H524 Presbyopia: Secondary | ICD-10-CM | POA: Diagnosis not present

## 2023-01-14 DIAGNOSIS — H25813 Combined forms of age-related cataract, bilateral: Secondary | ICD-10-CM | POA: Diagnosis not present

## 2023-01-21 ENCOUNTER — Ambulatory Visit (INDEPENDENT_AMBULATORY_CARE_PROVIDER_SITE_OTHER): Payer: Medicare Other | Admitting: Physician Assistant

## 2023-01-21 DIAGNOSIS — Z0000 Encounter for general adult medical examination without abnormal findings: Secondary | ICD-10-CM | POA: Diagnosis not present

## 2023-01-21 NOTE — Patient Instructions (Addendum)
MEDICARE ANNUAL WELLNESS VISIT Health Maintenance Summary and Written Plan of Care  [name] ,  Thank you for allowing me to perform your Medicare Annual Wellness Visit and for your ongoing commitment to your health.   Health Maintenance & Immunization History Health Maintenance  Topic Date Due   INFLUENZA VACCINE  03/20/2023   Lung Cancer Screening  03/30/2023   Medicare Annual Wellness (AWV)  01/21/2024   MAMMOGRAM  04/25/2024   Colonoscopy  12/20/2026   DTaP/Tdap/Td (4 - Td or Tdap) 03/19/2029   Pneumonia Vaccine 87+ Years old  Completed   DEXA SCAN  Completed   Hepatitis C Screening  Completed   Zoster Vaccines- Shingrix  Completed   HPV VACCINES  Aged Out   COVID-19 Vaccine  Discontinued   Immunization History  Administered Date(s) Administered   Fluad Quad(high Dose 65+) 04/06/2019, 06/01/2020, 05/17/2021, 06/13/2022   Influenza, High Dose Seasonal PF 05/26/2018   Influenza,inj,Quad PF,6+ Mos 06/23/2017, 06/24/2017   Influenza-Unspecified 06/23/2017, 06/24/2017, 04/06/2019, 06/01/2020, 05/17/2021, 06/13/2022   PFIZER(Purple Top)SARS-COV-2 Vaccination 09/16/2019, 10/14/2019, 05/17/2020   Pneumococcal Conjugate-13 11/25/2016   Pneumococcal Polysaccharide-23 01/28/2018   Td 05/18/2009   Td (Adult) 05/18/2009   Tdap 03/20/2019   Zoster Recombinat (Shingrix) 12/04/2021, 03/14/2022   Zoster, Live 10/15/2011    These are the patient goals that we discussed:  Goals Addressed               This Visit's Progress     Patient Stated (pt-stated)        Patient stated that she would like to be able to walk 2 miles a day like the way she was prior her injury.         This is a list of Health Maintenance Items that are overdue or due now: There are no preventive care reminders to display for this patient.    Orders/Referrals Placed Today: No orders of the defined types were placed in this encounter.  (Contact our referral department at [phone] if you  have not spoken with someone about your referral appointment within the next 5 days)    Follow-up Plan Follow-up with Jomarie Longs, PA-C as planned Medicare wellness visit in one year.  Patient will access AVS on my chart.      Health Maintenance, Female Adopting a healthy lifestyle and getting preventive care are important in promoting health and wellness. Ask your health care provider about: The right schedule for you to have regular tests and exams. Things you can do on your own to prevent diseases and keep yourself healthy. What should I know about diet, weight, and exercise? Eat a healthy diet  Eat a diet that includes plenty of vegetables, fruits, low-fat dairy products, and lean protein. Do not eat a lot of foods that are high in solid fats, added sugars, or sodium. Maintain a healthy weight Body mass index (BMI) is used to identify weight problems. It estimates body fat based on height and weight. Your health care provider can help determine your BMI and help you achieve or maintain a healthy weight. Get regular exercise Get regular exercise. This is one of the most important things you can do for your health. Most adults should: Exercise for at least 150 minutes each week. The exercise should increase your heart rate and make you sweat (moderate-intensity exercise). Do strengthening exercises at least twice a week. This is in addition to the moderate-intensity exercise. Spend less time sitting. Even light physical activity can be beneficial. Watch cholesterol  and blood lipids Have your blood tested for lipids and cholesterol at 72 years of age, then have this test every 5 years. Have your cholesterol levels checked more often if: Your lipid or cholesterol levels are high. You are older than 72 years of age. You are at high risk for heart disease. What should I know about cancer screening? Depending on your health history and family history, you may need to have cancer  screening at various ages. This may include screening for: Breast cancer. Cervical cancer. Colorectal cancer. Skin cancer. Lung cancer. What should I know about heart disease, diabetes, and high blood pressure? Blood pressure and heart disease High blood pressure causes heart disease and increases the risk of stroke. This is more likely to develop in people who have high blood pressure readings or are overweight. Have your blood pressure checked: Every 3-5 years if you are 11-12 years of age. Every year if you are 69 years old or older. Diabetes Have regular diabetes screenings. This checks your fasting blood sugar level. Have the screening done: Once every three years after age 85 if you are at a normal weight and have a low risk for diabetes. More often and at a younger age if you are overweight or have a high risk for diabetes. What should I know about preventing infection? Hepatitis B If you have a higher risk for hepatitis B, you should be screened for this virus. Talk with your health care provider to find out if you are at risk for hepatitis B infection. Hepatitis C Testing is recommended for: Everyone born from [birth year] through 1965. Anyone with known risk factors for hepatitis C. Sexually transmitted infections (STIs) Get screened for STIs, including gonorrhea and chlamydia, if: You are sexually active and are younger than 72 years of age. You are older than 72 years of age and your health care provider tells you that you are at risk for this type of infection. Your sexual activity has changed since you were last screened, and you are at increased risk for chlamydia or gonorrhea. Ask your health care provider if you are at risk. Ask your health care provider about whether you are at high risk for HIV. Your health care provider may recommend a prescription medicine to help prevent HIV infection. If you choose to take medicine to prevent HIV, you should first get tested for HIV. You  should then be tested every 3 months for as long as you are taking the medicine. Pregnancy If you are about to stop having your period (premenopausal) and you may become pregnant, seek counseling before you get pregnant. Take 400 to 800 micrograms (mcg) of folic acid every day if you become pregnant. Ask for birth control (contraception) if you want to prevent pregnancy. Osteoporosis and menopause Osteoporosis is a disease in which the bones lose minerals and strength with aging. This can result in bone fractures. If you are 29 years old or older, or if you are at risk for osteoporosis and fractures, ask your health care provider if you should: Be screened for bone loss. Take a calcium or vitamin D supplement to lower your risk of fractures. Be given hormone replacement therapy (HRT) to treat symptoms of menopause. Follow these instructions at home: Alcohol use Do not drink alcohol if: Your health care provider tells you not to drink. You are pregnant, may be pregnant, or are planning to become pregnant. If you drink alcohol: Limit how much you have to: 0-1 drink a day. Know  how much alcohol is in your drink. In the [place] one drink equals one 12 oz bottle of beer (355 mL), one 5 oz glass of wine (148 mL), or one 1 oz glass of hard liquor (44 mL). Lifestyle Do not use any products that contain nicotine or tobacco. These products include cigarettes, chewing tobacco, and vaping devices, such as e-cigarettes. If you need help quitting, ask your health care provider. Do not use street drugs. Do not share needles. Ask your health care provider for help if you need support or information about quitting drugs. General instructions Schedule regular health, dental, and eye exams. Stay current with your vaccines. Tell your health care provider if: You often feel depressed. You have ever been abused or do not feel safe at home. Summary Adopting a healthy lifestyle and getting preventive care are  important in promoting health and wellness. Follow your health care provider's instructions about healthy diet, exercising, and getting tested or screened for diseases. Follow your health care provider's instructions on monitoring your cholesterol and blood pressure. This information is not intended to replace advice given to you by your health care provider. Make sure you discuss any questions you have with your health care provider. Document Revised: 12/25/2020 Document Reviewed: 12/25/2020 Elsevier Patient Education  2024 [employer].

## 2023-01-21 NOTE — Progress Notes (Signed)
MEDICARE ANNUAL WELLNESS VISIT  01/21/2023  Telephone Visit Disclaimer This Medicare AWV was conducted by telephone due to national recommendations for restrictions regarding the COVID-19 Pandemic (e.g. social distancing).  I verified, using two identifiers, that I am speaking with Olivia Frank or their authorized healthcare agent. I discussed the limitations, risks, security, and privacy concerns of performing an evaluation and management service by telephone and the potential availability of an in-person appointment in the future. The patient expressed understanding and agreed to proceed.  Location of Patient: Home Location of Provider (nurse):  In the office.  Subjective:    Olivia Frank is a 72 y.o. female patient of Caleen Essex, Lonna Cobb, PA-C who had a [employer] Visit today via telephone. Olivia Frank is Retired and lives with their son. she has 2 children. she reports that she is socially active and does interact with friends/family regularly. she is moderately physically active and enjoys reading.  Patient Care Team: Nolene Ebbs as PCP - General (Family Medicine)     01/21/2023   10:01 AM 01/15/2022    3:05 PM 12/29/2020    1:31 PM 08/03/2019    9:19 AM 07/29/2018    9:09 AM 11/06/2015    3:16 PM 05/17/2015    8:42 AM  Advanced Directives  Does Patient Have a Medical Advance Directive? Yes Yes Yes Yes Yes No No  Type of Advance Directive Living will Living will;Healthcare Power of [employer] Power of [place];Living will Healthcare Power of [place];Living will Healthcare Power of [place];Living will    Does patient want to make changes to medical advance directive? No - Patient declined No - Patient declined  No - Patient declined No - Patient declined    Copy of Healthcare Power of Attorney in Chart?  Yes - validated most recent copy scanned in chart (See row information) No - copy requested No - copy requested No - copy requested    Would patient  like information on creating a medical advance directive?   No - Patient declined   No - patient declined information Yes - Educational materials given    Hospital Utilization Over the Past 12 Months: # of hospitalizations or ER visits: 2 # of surgeries: 1  Review of Systems    Patient reports that her overall health is unchanged compared to last year.  History obtained from chart review and the patient  Patient Reported Readings (BP, Pulse, CBG, Weight, etc) none  Pain Assessment Pain : No/denies pain     Current Medications & Allergies (verified) Allergies as of 01/21/2023       Reactions   Morphine And Codeine Hives, Itching   Ace Inhibitors    cough        Medication List        Accurate as of January 21, 2023 10:14 AM. If you have any questions, ask your nurse or doctor.          atorvastatin 40 MG tablet Commonly known as: LIPITOR Take 1 tablet by mouth once daily.   cephALEXin 500 MG capsule Commonly known as: KEFLEX Take 500 mg by mouth 3 (three) times daily.   cyanocobalamin 1000 MCG tablet Commonly known as: VITAMIN B12 Take 1,000 mcg by mouth daily.   Fish Oil 1000 MG Caps Take by mouth.   gabapentin 300 MG capsule Commonly known as: NEURONTIN Take 300 mg by mouth at bedtime.   Ventolin HFA 108 (90 Base) MCG/ACT inhaler Generic drug: albuterol INHALE 2 PUFFS  INTO LUNGS EVERY 4 HOURS AS NEEDED FOR WHEEZING AND FOR SHORTNESS OF BREATH   VITAMIN D (CHOLECALCIFEROL) PO Take 5,000 Units 3 (three) times daily by mouth.        History (reviewed): Past Medical History:  Diagnosis Date   COPD (chronic obstructive pulmonary disease) (HCC)    Melanoma (HCC)    Seasonal allergies    Past Surgical History:  Procedure Laterality Date   ABDOMINAL HYSTERECTOMY     ANKLE HARDWARE REMOVAL Right 12/05/2022   APPENDECTOMY     OVARIAN CYST REMOVAL     Family History  Problem Relation Age of Onset   Cancer Mother        breast   Hyperlipidemia  Mother    Heart disease Father    Cancer Sister    Seizures Sister    Cancer Maternal Grandmother        breast   Cancer Maternal Grandfather        colon   Social History   Socioeconomic History   Marital status: Married    Spouse name: Ethelene Browns   Number of children: 2   Years of education: 12   Highest education level: 12th grade  Occupational History   Occupation: retired    Comment: Data processing manager for [employer]  Tobacco Use   Smoking status: Every Day    Packs/day: 1.00    Years: 52.00    Additional pack years: 0.00    Total pack years: 52.00    Types: Cigarettes   Smokeless tobacco: Never  Vaping Use   Vaping Use: Never used  Substance and Sexual Activity   Alcohol use: Yes    Alcohol/week: 3.0 standard drinks of alcohol    Types: 3 Glasses of wine per week    Comment: a night with dinner   Drug use: No   Sexual activity: Not Currently  Other Topics Concern   Not on file  Social History Narrative   Her son is living with her at the moment. She had to move her husband into an assisted living facility. She is still smoking one pack per day. She enjoys reading and walking.   Social Determinants of Health   Financial Resource Strain: Low Risk  (01/21/2023)   Overall Financial Resource Strain (CARDIA)    Difficulty of Paying Living Expenses: Not hard at all  Food Insecurity: No Food Insecurity (01/21/2023)   Hunger Vital Sign    Worried About Running Out of Food in the Last Year: Never true    Ran Out of Food in the Last Year: Never true  Transportation Needs: No Transportation Needs (01/21/2023)   PRAPARE - Administrator, Civil Service (Medical): No    Lack of Transportation (Non-Medical): No  Physical Activity: Inactive (01/21/2023)   Exercise Vital Sign    Days of Exercise per Week: 0 days    Minutes of Exercise per Session: 0 min  Stress: No Stress Concern Present (01/21/2023)   [employer] of Occupational Health - Occupational Stress  Questionnaire    Feeling of Stress : Not at all  Social Connections: Moderately Integrated (01/21/2023)   Social Connection and Isolation Panel [NHANES]    Frequency of Communication with Friends and Family: More than three times a week    Frequency of Social Gatherings with Friends and Family: More than three times a week    Attends Religious Services: Never    Database administrator or Organizations: Yes    Attends Club or  Organization Meetings: More than 4 times per year    Marital Status: Married    Activities of Daily Living    01/21/2023   10:08 AM  In your present state of health, do you have any difficulty performing the following activities:  Hearing? 0  Vision? 1  Difficulty concentrating or making decisions? 0  Walking or climbing stairs? 1  Comment ankle pain  Dressing or bathing? 0  Doing errands, shopping? 0  Preparing Food and eating ? N  Using the Toilet? N  In the past six months, have you accidently leaked urine? N  Do you have problems with loss of bowel control? N  Managing your Medications? N  Managing your Finances? N  Housekeeping or managing your Housekeeping? N    Patient Education/ Literacy How often do you need to have someone help you when you read instructions, pamphlets, or other written materials from your doctor or pharmacy?: 1 - Never What is the last grade level you completed in school?: 12th grade  Exercise Current Exercise Habits: The patient does not participate in regular exercise at present, Exercise limited by: orthopedic condition(s)  Diet Patient reports consuming 3 meals a day and 1 snack(s) a day Patient reports that her primary diet is: Regular Patient reports that she does have regular access to food.   Depression Screen    01/21/2023   10:02 AM 10/11/2022    8:19 AM 04/10/2022    8:33 AM 01/15/2022    3:01 PM 12/29/2020    1:22 PM 11/03/2020    9:27 AM 11/03/2019    8:33 AM  PHQ 2/9 Scores  PHQ - 2 Score 0 0 0 0 3 1 2   PHQ- 9  Score     5 3 4      Fall Risk    01/21/2023   10:01 AM 10/11/2022    8:19 AM 01/15/2022    3:01 PM 12/29/2020    1:22 PM 11/03/2020    9:27 AM  Fall Risk   Falls in the past year? 1 0 0 0 0  Number falls in past yr: 0 0 0 0 0  Injury with Fall? 1 0 0 0 0  Risk for fall due to : Impaired mobility;History of fall(s) No Fall Risks No Fall Risks No Fall Risks   Follow up Falls evaluation completed;Education provided;Falls prevention discussed Falls evaluation completed Falls evaluation completed Falls evaluation completed Falls evaluation completed     Objective:  Olivia Frank seemed alert and oriented and she participated appropriately during our telephone visit.  Blood Pressure Weight BMI  BP Readings from Last 3 Encounters:  10/11/22 130/74  04/10/22 138/70  11/03/20 (!) 152/76   Wt Readings from Last 3 Encounters:  10/11/22 173 lb 0.6 oz (78.5 kg)  04/10/22 178 lb (80.7 kg)  01/15/22 174 lb (78.9 kg)   BMI Readings from Last 1 Encounters:  10/11/22 28.80 kg/m    *Unable to obtain current vital signs, weight, and BMI due to telephone visit type  Hearing/Vision  Olivia Frank did not seem to have difficulty with hearing/understanding during the telephone conversation Reports that she has had a formal eye exam by an eye care professional within the past year Reports that she has not had a formal hearing evaluation within the past year *Unable to fully assess hearing and vision during telephone visit type  Cognitive Function:    01/21/2023   10:10 AM 01/15/2022    3:05 PM 12/29/2020    1:34 PM 11/03/2019  8:11 AM 08/03/2019    9:23 AM  6CIT Screen  What Year? 0 points 0 points 0 points 0 points 0 points  What month? 0 points 0 points 0 points 0 points 0 points  What time? 0 points 0 points 0 points 0 points 0 points  Count back from 20 0 points 0 points 0 points 0 points 0 points  Months in reverse 0 points 0 points 0 points 0 points 0 points  Repeat phrase 0 points 0 points 0  points 2 points 2 points  Total Score 0 points 0 points 0 points 2 points 2 points   (Normal:0-7, Significant for Dysfunction: >8)  Normal Cognitive Function Screening: Yes   Immunization & Health Maintenance Record Immunization History  Administered Date(s) Administered   Fluad Quad(high Dose 65+) 04/06/2019, 06/01/2020, 05/17/2021, 06/13/2022   Influenza, High Dose Seasonal PF 05/26/2018   Influenza,inj,Quad PF,6+ Mos 06/23/2017, 06/24/2017   Influenza-Unspecified 06/23/2017, 06/24/2017, 04/06/2019, 06/01/2020, 05/17/2021, 06/13/2022   PFIZER(Purple Top)SARS-COV-2 Vaccination 09/16/2019, 10/14/2019, 05/17/2020   Pneumococcal Conjugate-13 11/25/2016   Pneumococcal Polysaccharide-23 01/28/2018   Td 05/18/2009   Td (Adult) 05/18/2009   Tdap 03/20/2019   Zoster Recombinat (Shingrix) 12/04/2021, 03/14/2022   Zoster, Live 10/15/2011    Health Maintenance  Topic Date Due   INFLUENZA VACCINE  03/20/2023   Lung Cancer Screening  03/30/2023   Medicare Annual Wellness (AWV)  01/21/2024   MAMMOGRAM  04/25/2024   Colonoscopy  12/20/2026   DTaP/Tdap/Td (4 - Td or Tdap) 03/19/2029   Pneumonia Vaccine 44+ Years old  Completed   DEXA SCAN  Completed   Hepatitis C Screening  Completed   Zoster Vaccines- Shingrix  Completed   HPV VACCINES  Aged Out   COVID-19 Vaccine  Discontinued       Assessment  This is a routine wellness examination for Olivia Frank.  Health Maintenance: Due or Overdue There are no preventive care reminders to display for this patient.   Olivia Frank does not need a referral for Community Assistance: Care Management:   no Social Work:    no Prescription Assistance:  no Nutrition/Diabetes Education:  no   Plan:  Personalized Goals  Goals Addressed               This Visit's Progress     Patient Stated (pt-stated)        Patient stated that she would like to be able to walk 2 miles a day like the way she was prior her injury.        Personalized Health Maintenance & Screening Recommendations    Lung Cancer Screening Recommended: yes; scheduled for 03/31/23. (Low Dose CT Chest recommended if Age 51-80 years, 20 pack-year currently smoking OR have quit w/in past 15 years) Hepatitis C Screening recommended: no HIV Screening recommended: no  Advanced Directives: Written information was not prepared per patient's request.  Referrals & Orders No orders of the defined types were placed in this encounter.   Follow-up Plan Follow-up with Jomarie Longs, PA-C as planned Medicare wellness visit in one year.  Patient will access AVS on my chart.   I have personally reviewed and noted the following in the patient's chart:   Medical and social history Use of alcohol, tobacco or illicit drugs  Current medications and supplements Functional ability and status Nutritional status Physical activity Advanced directives List of other physicians Hospitalizations, surgeries, and ER visits in previous 12 months Vitals Screenings to include cognitive, depression, and falls Referrals and appointments  In addition, I have reviewed and discussed with Olivia Frank certain preventive protocols, quality metrics, and best practice recommendations. A written personalized care plan for preventive services as well as general preventive health recommendations is available and can be mailed to the patient at her request.      Modesto Charon, RN BSN  01/21/2023

## 2023-01-24 DIAGNOSIS — E7800 Pure hypercholesterolemia, unspecified: Secondary | ICD-10-CM | POA: Diagnosis not present

## 2023-01-24 DIAGNOSIS — J449 Chronic obstructive pulmonary disease, unspecified: Secondary | ICD-10-CM | POA: Diagnosis not present

## 2023-01-24 DIAGNOSIS — T847XXA Infection and inflammatory reaction due to other internal orthopedic prosthetic devices, implants and grafts, initial encounter: Secondary | ICD-10-CM | POA: Diagnosis not present

## 2023-01-24 DIAGNOSIS — Z1330 Encounter for screening examination for mental health and behavioral disorders, unspecified: Secondary | ICD-10-CM | POA: Diagnosis not present

## 2023-01-24 NOTE — Addendum Note (Signed)
Addended by: Jomarie Longs on: 01/24/2023 06:25 AM   Modules accepted: Level of Service

## 2023-02-19 DIAGNOSIS — H35373 Puckering of macula, bilateral: Secondary | ICD-10-CM | POA: Diagnosis not present

## 2023-03-13 ENCOUNTER — Ambulatory Visit (INDEPENDENT_AMBULATORY_CARE_PROVIDER_SITE_OTHER): Payer: Medicare Other | Admitting: Sports Medicine

## 2023-03-13 ENCOUNTER — Ambulatory Visit (INDEPENDENT_AMBULATORY_CARE_PROVIDER_SITE_OTHER): Payer: Medicare Other

## 2023-03-13 DIAGNOSIS — S82841A Displaced bimalleolar fracture of right lower leg, initial encounter for closed fracture: Secondary | ICD-10-CM | POA: Insufficient documentation

## 2023-03-13 DIAGNOSIS — M170 Bilateral primary osteoarthritis of knee: Secondary | ICD-10-CM

## 2023-03-13 DIAGNOSIS — M47812 Spondylosis without myelopathy or radiculopathy, cervical region: Secondary | ICD-10-CM | POA: Diagnosis not present

## 2023-03-13 DIAGNOSIS — S82841S Displaced bimalleolar fracture of right lower leg, sequela: Secondary | ICD-10-CM

## 2023-03-13 DIAGNOSIS — M7731 Calcaneal spur, right foot: Secondary | ICD-10-CM | POA: Diagnosis not present

## 2023-03-13 DIAGNOSIS — M25571 Pain in right ankle and joints of right foot: Secondary | ICD-10-CM | POA: Diagnosis not present

## 2023-03-13 DIAGNOSIS — M4722 Other spondylosis with radiculopathy, cervical region: Secondary | ICD-10-CM

## 2023-03-13 DIAGNOSIS — Z472 Encounter for removal of internal fixation device: Secondary | ICD-10-CM | POA: Diagnosis not present

## 2023-03-13 MED ORDER — ACETAMINOPHEN ER 650 MG PO TBCR
650.0000 mg | EXTENDED_RELEASE_TABLET | Freq: Three times a day (TID) | ORAL | Status: AC | PRN
Start: 2023-03-13 — End: ?

## 2023-03-13 NOTE — Assessment & Plan Note (Signed)
Known knee osteoarthritis, seems to be controlled with arthritis Tylenol. Adding some home conditioning, she will do some compression, icing, return as needed for this.

## 2023-03-13 NOTE — Progress Notes (Signed)
    Procedures performed today:    None.  Independent interpretation of notes and tests performed by another provider:   None.  Brief History, Exam, Impression, and Recommendations:    Primary osteoarthritis of both knees Known knee osteoarthritis, seems to be controlled with arthritis Tylenol. Adding some home conditioning, she will do some compression, icing, return as needed for this.  Cervical spondylosis with radiculopathy Multifactorial neck pain, axial pain related to left-sided C3-C5 facet joint arthritis, historically left-sided C3-C5 facet joint injections have resulted in 100% long-term pain relief, she is having recurrence of axial pain, we will repeat the above. Left-sided cervical epidurals have done well for her radicular pain, as she is only having axial pain we will proceed with the facet injections.  Bimalleolar fracture of right ankle status post ORIF, posterior fibular hardware removal Historical bimalleolar fracture, ORIF in [facility], lateral hardware removal by Dr. Ihor Gully, now having increasing pain medially, I would like some x-rays and we will get her back in with Dr. Ihor Gully.  I spent 30 minutes of total time managing this patient today, this includes chart review, face to face, and non-face to face time.  ____________________________________________ Ihor Austin. Benjamin Stain, M.D., ABFM., CAQSM., AME. Primary Care and Sports Medicine [facility]  Adjunct Professor of Family Medicine  [employer]  Restaurant manager, fast food

## 2023-03-13 NOTE — Assessment & Plan Note (Signed)
Historical bimalleolar fracture, ORIF in [facility], lateral hardware removal by Dr. Ihor Gully, now having increasing pain medially, I would like some x-rays and we will get her back in with Dr. Ihor Gully.

## 2023-03-13 NOTE — Assessment & Plan Note (Signed)
Multifactorial neck pain, axial pain related to left-sided C3-C5 facet joint arthritis, historically left-sided C3-C5 facet joint injections have resulted in 100% long-term pain relief, she is having recurrence of axial pain, we will repeat the above. Left-sided cervical epidurals have done well for her radicular pain, as she is only having axial pain we will proceed with the facet injections.

## 2023-03-14 ENCOUNTER — Encounter: Payer: Self-pay | Admitting: Sports Medicine

## 2023-03-31 ENCOUNTER — Ambulatory Visit: Payer: Medicare Other

## 2023-04-01 ENCOUNTER — Ambulatory Visit (INDEPENDENT_AMBULATORY_CARE_PROVIDER_SITE_OTHER): Payer: Medicare Other

## 2023-04-01 DIAGNOSIS — F17210 Nicotine dependence, cigarettes, uncomplicated: Secondary | ICD-10-CM | POA: Diagnosis not present

## 2023-04-01 DIAGNOSIS — Z122 Encounter for screening for malignant neoplasm of respiratory organs: Secondary | ICD-10-CM | POA: Diagnosis not present

## 2023-04-01 DIAGNOSIS — Z87891 Personal history of nicotine dependence: Secondary | ICD-10-CM

## 2023-04-04 ENCOUNTER — Other Ambulatory Visit: Payer: Self-pay | Admitting: Sports Medicine

## 2023-04-04 DIAGNOSIS — M47812 Spondylosis without myelopathy or radiculopathy, cervical region: Secondary | ICD-10-CM

## 2023-04-07 ENCOUNTER — Telehealth: Payer: Self-pay | Admitting: Physician Assistant

## 2023-04-07 ENCOUNTER — Other Ambulatory Visit: Payer: Self-pay

## 2023-04-07 DIAGNOSIS — F17210 Nicotine dependence, cigarettes, uncomplicated: Secondary | ICD-10-CM

## 2023-04-07 DIAGNOSIS — Z87891 Personal history of nicotine dependence: Secondary | ICD-10-CM

## 2023-04-07 DIAGNOSIS — Z122 Encounter for screening for malignant neoplasm of respiratory organs: Secondary | ICD-10-CM

## 2023-04-07 NOTE — Telephone Encounter (Signed)
Patient is stating that her insurance will not cover her visit from 2022-10-19, because it was not coded like it was previously on 04/10/22, last year. Can you please take a look and amend if necessary? Her next appt will be on 04/11/23. Thank you.

## 2023-04-11 ENCOUNTER — Encounter: Payer: Self-pay | Admitting: Physician Assistant

## 2023-04-11 ENCOUNTER — Ambulatory Visit (INDEPENDENT_AMBULATORY_CARE_PROVIDER_SITE_OTHER): Payer: Medicare Other | Admitting: Physician Assistant

## 2023-04-11 VITALS — BP 146/76 | HR 76 | Ht 65.0 in | Wt 172.0 lb

## 2023-04-11 DIAGNOSIS — I270 Primary pulmonary hypertension: Secondary | ICD-10-CM | POA: Insufficient documentation

## 2023-04-11 DIAGNOSIS — I2510 Atherosclerotic heart disease of native coronary artery without angina pectoris: Secondary | ICD-10-CM | POA: Diagnosis not present

## 2023-04-11 DIAGNOSIS — E782 Mixed hyperlipidemia: Secondary | ICD-10-CM

## 2023-04-11 DIAGNOSIS — Z8249 Family history of ischemic heart disease and other diseases of the circulatory system: Secondary | ICD-10-CM

## 2023-04-11 DIAGNOSIS — J432 Centrilobular emphysema: Secondary | ICD-10-CM | POA: Diagnosis not present

## 2023-04-11 DIAGNOSIS — I272 Pulmonary hypertension, unspecified: Secondary | ICD-10-CM

## 2023-04-11 DIAGNOSIS — Z23 Encounter for immunization: Secondary | ICD-10-CM | POA: Diagnosis not present

## 2023-04-11 DIAGNOSIS — Z9189 Other specified personal risk factors, not elsewhere classified: Secondary | ICD-10-CM | POA: Diagnosis not present

## 2023-04-11 DIAGNOSIS — I700 Atherosclerosis of aorta: Secondary | ICD-10-CM | POA: Diagnosis not present

## 2023-04-11 MED ORDER — HYDROCHLOROTHIAZIDE 12.5 MG PO TABS: 12.5000 mg | ORAL_TABLET | Freq: Every day | ORAL | 1 refills | Status: DC

## 2023-04-11 MED ORDER — ATORVASTATIN CALCIUM 40 MG PO TABS
ORAL_TABLET | ORAL | 3 refills | Status: DC
Start: 2023-04-11 — End: 2024-04-13

## 2023-04-11 NOTE — Progress Notes (Signed)
Established Patient Office Visit  Subjective   Patient ID: Olivia Frank, female    DOB: [birth year]  Age: 72 y.o. MRN: [id number]  Chief Complaint  Patient presents with   Medical Management of Chronic Issues    6 month follow up , lung screening results    HPI Pt is a 72 yo female with COPD, Aortic atherosclerosis, CAD who presents to the clinic for follow up.   She recently had CT low dose lung screening and would like to go over those results.   She does continue to smoke daily. She denies any CP, SOB, headaches or vision changes. She is not checking her BP.  She is overall concerned with her CV health due to her high risk score.      Patient Active Problem List   Diagnosis Date Noted   Pulmonary hypertension, primary (HCC) 04/11/2023   Family history of heart disease 04/11/2023   Family history of early CAD 04/11/2023   Bimalleolar fracture of right ankle status post ORIF, posterior fibular hardware removal 03/13/2023   Lateral epicondylitis, left elbow 10/03/2021   Encounter for screening for lung cancer 12/29/2020   Elevated BP without diagnosis of hypertension 11/03/2020   Tobacco dependence 11/03/2019   Hoarse voice quality 11/03/2019   Current smoker 11/03/2019   Newly recognized heart murmur 11/03/2019   Pulmonary nodule, right 12/26/2016   Emphysema lung (HCC) 12/26/2016   Coronary atherosclerosis 12/26/2016   Aortic atherosclerosis (HCC) 12/26/2016   Hyperplastic colon polyp 12/23/2016   Essential hypertension 12/23/2016   Basal cell carcinoma of left lower leg 03/22/2016   Cervical spondylosis with radiculopathy 11/06/2015   Hyperlipidemia 06/06/2015   Hypertriglyceridemia 06/06/2015   Stress at home 05/17/2015   Cerumen impaction 05/17/2015   Primary osteoarthritis of both knees 11/29/2014   Fracture of radial head, right, closed 10/08/2013   Left wrist sprain 10/08/2013   Low back pain 04/14/2012   CHRONIC OBSTRUCTIVE PULMONARY DISEASE, ACUTE  10/04/2010   SINUSITIS 09/22/2009   Postmenopausal 05/18/2009   Melanoma of skin (HCC) 04/20/2009   Past Medical History:  Diagnosis Date   COPD (chronic obstructive pulmonary disease) (HCC)    Melanoma (HCC)    Seasonal allergies    Family History  Problem Relation Age of Onset   Cancer Mother        breast   Hyperlipidemia Mother    Heart disease Father    Cancer Sister    Seizures Sister    Cancer Maternal Grandmother        breast   Cancer Maternal Grandfather        colon   Allergies  Allergen Reactions   Morphine And Codeine Hives and Itching   Ace Inhibitors     cough      Review of Systems  All other systems reviewed and are negative.     Objective:     BP (!) 146/76   Pulse 76   Ht 5\' 5"  (1.651 m)   Wt 172 lb (78 kg)   SpO2 99%   BMI 28.62 kg/m  BP Readings from Last 3 Encounters:  04/11/23 (!) 146/76  10/11/22 130/74  04/10/22 138/70   Wt Readings from Last 3 Encounters:  04/11/23 172 lb (78 kg)  04/01/23 173 lb (78.5 kg)  10/11/22 173 lb 0.6 oz (78.5 kg)      Physical Exam Constitutional:      Appearance: Normal appearance.  HENT:     Head: Normocephalic.  Cardiovascular:  Rate and Rhythm: Normal rate and regular rhythm.     Pulses: Normal pulses.     Heart sounds: Murmur heard.  Pulmonary:     Effort: Pulmonary effort is normal.     Breath sounds: Normal breath sounds.  Musculoskeletal:     Cervical back: Normal range of motion and neck supple.     Right lower leg: No edema.     Left lower leg: No edema.  Neurological:     General: No focal deficit present.     Mental Status: She is alert and oriented to person, place, and time.  Psychiatric:        Mood and Affect: Mood normal.         Last CBC Lab Results  Component Value Date   WBC 10.1 11/06/2020   HGB 16.0 (H) 11/06/2020   HCT 47.3 (H) 11/06/2020   MCV 94.0 11/06/2020   MCH 31.8 11/06/2020   RDW 12.2 11/06/2020   PLT 309 11/06/2020   Last metabolic  panel Lab Results  Component Value Date   GLUCOSE 74 10/11/2022   NA 142 10/11/2022   K 4.4 10/11/2022   CL 104 10/11/2022   CO2 27 10/11/2022   BUN 14 10/11/2022   CREATININE 0.62 10/11/2022   EGFR 95 10/11/2022   CALCIUM 9.5 10/11/2022   PROT 6.7 10/11/2022   ALBUMIN 4.1 11/25/2016   BILITOT 0.4 10/11/2022   ALKPHOS 86 11/25/2016   AST 19 10/11/2022   ALT 28 10/11/2022   Last lipids Lab Results  Component Value Date   CHOL 163 10/11/2022   HDL 60 10/11/2022   LDLCALC 76 10/11/2022   TRIG 178 (H) 10/11/2022   CHOLHDL 2.7 10/11/2022   Last hemoglobin A1c No results found for: "HGBA1C" Last thyroid functions Lab Results  Component Value Date   TSH 2.06 10/11/2022   Last vitamin D Lab Results  Component Value Date   VD25OH 78 06/05/2015   Last vitamin B12 and Folate Lab Results  Component Value Date   VITAMINB12 632 06/05/2015      The 10-year ASCVD risk score (Arnett DK, et al., 2019) is: 27.9%    Assessment & Plan:  Marland KitchenMarland KitchenTasha was seen today for medical management of chronic issues.  Diagnoses and all orders for this visit:  Centrilobular emphysema (HCC)  Aortic atherosclerosis (HCC) -     atorvastatin (LIPITOR) 40 MG tablet; Take 1 tablet by mouth once daily. -     EXERCISE TOLERANCE TEST (ETT); Future -     Cardiac Stress Test: Informed Consent Details: Physician/Practitioner Attestation; Transcribe to consent form and obtain patient signature  Pulmonary hypertension (HCC) -     hydrochlorothiazide (HYDRODIURIL) 12.5 MG tablet; Take 1 tablet (12.5 mg total) by mouth daily. -     EXERCISE TOLERANCE TEST (ETT); Future -     Cardiac Stress Test: Informed Consent Details: Physician/Practitioner Attestation; Transcribe to consent form and obtain patient signature  Flu vaccine need -     Flu vaccine HIGH DOSE PF (Fluzone High dose)  Mixed hyperlipidemia -     atorvastatin (LIPITOR) 40 MG tablet; Take 1 tablet by mouth once daily. -     EXERCISE  TOLERANCE TEST (ETT); Future -     Cardiac Stress Test: Informed Consent Details: Physician/Practitioner Attestation; Transcribe to consent form and obtain patient signature  Aortic atherosclerosis (HCC) -     atorvastatin (LIPITOR) 40 MG tablet; Take 1 tablet by mouth once daily. -     EXERCISE TOLERANCE  TEST (ETT); Future -     Cardiac Stress Test: Informed Consent Details: Physician/Practitioner Attestation; Transcribe to consent form and obtain patient signature  Atherosclerosis of native coronary artery of native heart without angina pectoris -     atorvastatin (LIPITOR) 40 MG tablet; Take 1 tablet by mouth once daily. -     EXERCISE TOLERANCE TEST (ETT); Future -     Cardiac Stress Test: Informed Consent Details: Physician/Practitioner Attestation; Transcribe to consent form and obtain patient signature  Family history of heart disease -     EXERCISE TOLERANCE TEST (ETT); Future -     Cardiac Stress Test: Informed Consent Details: Physician/Practitioner Attestation; Transcribe to consent form and obtain patient signature  Family history of early CAD -     EXERCISE TOLERANCE TEST (ETT); Future -     Cardiac Stress Test: Informed Consent Details: Physician/Practitioner Attestation; Transcribe to consent form and obtain patient signature  At high risk for cardiovascular disease -     EXERCISE TOLERANCE TEST (ETT); Future -     Cardiac Stress Test: Informed Consent Details: Physician/Practitioner Attestation; Transcribe to consent form and obtain patient signature   BP elevated today.   IMPRESSION: 1. Lung-RADS 2, benign appearance or behavior. Continue annual screening with low-dose chest CT without contrast in 12 months. 2. Pulmonary artery enlargement suggests pulmonary arterial hypertension. 3. Similar minimal pericardial effusion. 4. Aortic Atherosclerosis (ICD10-I70.0) and Emphysema (ICD10-J43.9). Coronary artery atherosclerosis.  Continue on statin, LDL 76 on last check.   Discussed pulmonary HTN, start hydrochlorothiazide 12.5mg  daily HO given Discussed smoking cessation and patient declined Echo 2021 WNL Pt has a high 10 year CV risk I would like to get a screening stress test despite no symptoms, will order  Recheck 2 weeks nurse visit for BP.    Return in about 6 months (around 10/12/2023) for 2 week BP check .    Tandy Gaw, PA-C

## 2023-04-11 NOTE — Patient Instructions (Addendum)
Start hydrochlorothiazide 12.5mg  daily in the morning.   Pulmonary Hypertension Pulmonary hypertension is a condition that causes high blood pressure in the blood vessels of your lungs. This makes your heart work extra hard to pump blood to your lungs. And when your heart has to work harder, it can be harder for you to breathe. Over time, this can hurt and weaken your heart. What are the causes? This condition can be put into one of five groups based on what causes it. Group 1 happens when the blood vessels that carry blood to your lungs get too thick or stiff. This may happen with no known cause or it may be: Inherited. This means it's passed from parent to child. Caused by another disease, such as a disease of the heart or liver. Caused by some medicines or poisons. Group 2 happens if you have problems with the valves in your heart or if the left side of your heart, also called your left ventricle, gets weak. Group 3 can be caused by long-term diseases of the lungs, such as chronic obstructive pulmonary disease or COPD. This can also happen if you don't get enough oxygen, such as if you have trouble breathing when you sleep or if you live at a high altitude. Group 4 is caused by blood clots in your lungs. Group 5 includes other causes, such as sickle cell anemia or growths of cells that aren't normal called tumors. What are the signs or symptoms? Symptoms may include: Shortness of breath. A cough. Tiredness. Feeling dizzy or light-headed. You may also faint. A fast heart beat. You may also feel your heart flutter or skip a beat. Your lips or fingers turning blue. Chest pain or tightness. How is this diagnosed? This condition may be diagnosed with: Blood tests. Imaging tests. These may include: Chest X-rays. CT scans. An echocardiogram. This is an ultrasound of your heart. A ventilation-perfusion scan. This sees how well air and blood flow in and out of your lungs. A pulmonary  function test. This looks at how much air your lungs can hold. A 6-minute walk test. This may be done to check your breathing during exercise. Cardiac catheterization. This is a procedure that uses a soft tube called a catheter to check the arteries of your heart. A lung biopsy. This is when a small piece of tissue is removed from your lung for testing. How is this treated? There's no cure for this condition. But treatment can help you feel better and can slow the condition down. You may need: Oxygen therapy. Cardiac rehabilitation, or rehab. This is a program that teaches you how to: Care for your heart. Exercise. Go back to your normal activities. Medicines to: Lower your blood pressure. Relax the blood vessels in your lungs. Help your heart pump more blood. Help your body get rid of extra fluid. Thin your blood. This can stop you from getting blood clots. Lung surgery. This can relieve pressure on your heart. You may need this if other treatments don't work. Heart-lung or lung transplant. This may be done in very severe cases. Follow these instructions at home: Eating and drinking  Eat a healthy diet. Eat lots of fresh fruits and vegetables, whole grains, and beans. Limit how much salt you take in to less than 2,300 mg a day. Salt is also called sodium. Activity Get lots of rest. Exercise as told. Ask your health care provider what types of exercise are safe for you. Lifestyle Do not use any products that  contain nicotine or tobacco. These products include cigarettes, chewing tobacco, and vaping devices, such as e-cigarettes. If you need help quitting, ask your provider. Stay away when other people smoke. Do not sit in hot tubs or saunas for long stretches of time. Do not get pregnant. If needed, talk with your provider about birth control. Avoid high altitudes. It can be stressful to live with pulmonary hypertension. Talk with your provider about finding support groups and online  help. General instructions Take over-the-counter and prescription medicines only as told by your provider. Do not change or stop medicines without talking with your provider. Stay up to date on your shots, such as your yearly flu shot and pneumonia shot. Use oxygen therapy at home as told. Keep all follow-up visits. Your provider will check your breathing and make changes to your medicines as needed. Contact a health care provider if: Your cough gets worse. You have more shortness of breath. You start to have trouble doing things you could do before. You need to use more medicines or oxygen, or you need to use them more often than normal. Get help right away if: You have severe shortness of breath. You have chest pain or pressure. You cough up blood. These symptoms may be an emergency. Get help right away. Call 911. Do not wait to see if the symptoms will go away. Do not drive yourself to the hospital. This information is not intended to replace advice given to you by your health care provider. Make sure you discuss any questions you have with your health care provider. Document Revised: 10/21/2022 Document Reviewed: 10/21/2022 Elsevier Patient Education  2024 [employer].

## 2023-04-23 ENCOUNTER — Encounter: Payer: Self-pay | Admitting: Sports Medicine

## 2023-04-24 NOTE — Telephone Encounter (Signed)
FYI

## 2023-04-24 NOTE — Telephone Encounter (Signed)
I think you meant to route to Dr. Ashley Royalty. Lileah is my patient but Susy Frizzle is Dr. Judie Petit. I routed it to him.

## 2023-04-28 ENCOUNTER — Encounter: Payer: Self-pay | Admitting: Physician Assistant

## 2023-05-02 ENCOUNTER — Ambulatory Visit: Payer: Medicare Other

## 2023-05-07 DIAGNOSIS — M25561 Pain in right knee: Secondary | ICD-10-CM | POA: Diagnosis not present

## 2023-05-07 DIAGNOSIS — I159 Secondary hypertension, unspecified: Secondary | ICD-10-CM | POA: Diagnosis not present

## 2023-05-07 DIAGNOSIS — M25562 Pain in left knee: Secondary | ICD-10-CM | POA: Diagnosis not present

## 2023-05-07 DIAGNOSIS — M25571 Pain in right ankle and joints of right foot: Secondary | ICD-10-CM | POA: Diagnosis not present

## 2023-05-08 DIAGNOSIS — Z872 Personal history of diseases of the skin and subcutaneous tissue: Secondary | ICD-10-CM | POA: Diagnosis not present

## 2023-05-08 DIAGNOSIS — L989 Disorder of the skin and subcutaneous tissue, unspecified: Secondary | ICD-10-CM | POA: Diagnosis not present

## 2023-05-08 DIAGNOSIS — D225 Melanocytic nevi of trunk: Secondary | ICD-10-CM | POA: Diagnosis not present

## 2023-05-08 DIAGNOSIS — L814 Other melanin hyperpigmentation: Secondary | ICD-10-CM | POA: Diagnosis not present

## 2023-05-08 DIAGNOSIS — L708 Other acne: Secondary | ICD-10-CM | POA: Diagnosis not present

## 2023-05-08 DIAGNOSIS — Z08 Encounter for follow-up examination after completed treatment for malignant neoplasm: Secondary | ICD-10-CM | POA: Diagnosis not present

## 2023-05-08 DIAGNOSIS — Z85828 Personal history of other malignant neoplasm of skin: Secondary | ICD-10-CM | POA: Diagnosis not present

## 2023-05-08 DIAGNOSIS — L820 Inflamed seborrheic keratosis: Secondary | ICD-10-CM | POA: Diagnosis not present

## 2023-05-08 DIAGNOSIS — D2271 Melanocytic nevi of right lower limb, including hip: Secondary | ICD-10-CM | POA: Diagnosis not present

## 2023-05-08 DIAGNOSIS — Z85820 Personal history of malignant melanoma of skin: Secondary | ICD-10-CM | POA: Diagnosis not present

## 2023-05-14 NOTE — Discharge Instructions (Signed)
Post Procedure Spinal Discharge Instruction Sheet  You may resume a regular diet and any medications that you routinely take (including pain medications) unless otherwise noted by MD.  No driving day of procedure.  Light activity throughout the rest of the day.  Do not do any strenuous work, exercise, bending or lifting.  The day following the procedure, you can resume normal physical activity but you should refrain from exercising or physical therapy for at least three days thereafter.  You may apply ice to the injection site, 20 minutes on, 20 minutes off, as needed. Do not apply ice directly to skin.    Common Side Effects:  Headaches- take your usual medications as directed by your physician.  Increase your fluid intake.  Caffeinated beverages may be helpful.  Lie flat in bed until your headache resolves.  Restlessness or inability to sleep- you may have trouble sleeping for the next few days.  Ask your referring physician if you need any medication for sleep.  Facial flushing or redness- should subside within a few days.  Increased pain- a temporary increase in pain a day or two following your procedure is not unusual.  Take your pain medication as prescribed by your referring physician.  Leg cramps  Please contact our office at [phone] for the following symptoms: Fever greater than 100 degrees. Headaches unresolved with medication after 2-3 days. Increased swelling, pain, or redness at injection site.   Thank you for visiting [facility] today.    YOU MAY RESUME YOUR ASPIRIN TODAY, POST PROCEDURE.

## 2023-05-15 ENCOUNTER — Ambulatory Visit
Admission: RE | Admit: 2023-05-15 | Discharge: 2023-05-15 | Disposition: A | Discharge status: Home / Self Care | Payer: Medicare Other | Source: Ambulatory Visit | Attending: Sports Medicine | Admitting: Sports Medicine

## 2023-05-15 DIAGNOSIS — M47812 Spondylosis without myelopathy or radiculopathy, cervical region: Secondary | ICD-10-CM

## 2023-05-15 MED ORDER — DEXAMETHASONE SODIUM PHOSPHATE 4 MG/ML IJ SOLN
10.0000 mg | Freq: Once | INTRAMUSCULAR | Status: AC
  Administered 2023-05-15: 10 mg via INTRA_ARTICULAR

## 2023-05-15 MED ORDER — IOPAMIDOL (ISOVUE-M 300) INJECTION 61%
1.0000 mL | Freq: Once | INTRAMUSCULAR | Status: AC | PRN
  Administered 2023-05-15: 1 mL via INTRA_ARTICULAR

## 2023-06-09 DIAGNOSIS — H02834 Dermatochalasis of left upper eyelid: Secondary | ICD-10-CM | POA: Diagnosis not present

## 2023-06-09 DIAGNOSIS — H02831 Dermatochalasis of right upper eyelid: Secondary | ICD-10-CM | POA: Diagnosis not present

## 2023-06-13 DIAGNOSIS — T8484XA Pain due to internal orthopedic prosthetic devices, implants and grafts, initial encounter: Secondary | ICD-10-CM | POA: Diagnosis not present

## 2023-06-20 DIAGNOSIS — M25571 Pain in right ankle and joints of right foot: Secondary | ICD-10-CM | POA: Diagnosis not present

## 2023-07-01 DIAGNOSIS — H35373 Puckering of macula, bilateral: Secondary | ICD-10-CM | POA: Diagnosis not present

## 2023-07-01 DIAGNOSIS — H43813 Vitreous degeneration, bilateral: Secondary | ICD-10-CM | POA: Diagnosis not present

## 2023-07-01 DIAGNOSIS — H25813 Combined forms of age-related cataract, bilateral: Secondary | ICD-10-CM | POA: Diagnosis not present

## 2023-07-01 DIAGNOSIS — H40013 Open angle with borderline findings, low risk, bilateral: Secondary | ICD-10-CM | POA: Diagnosis not present

## 2023-07-21 DIAGNOSIS — M25571 Pain in right ankle and joints of right foot: Secondary | ICD-10-CM | POA: Diagnosis not present

## 2023-10-15 ENCOUNTER — Encounter: Payer: Self-pay | Admitting: Physician Assistant

## 2023-10-15 ENCOUNTER — Ambulatory Visit (INDEPENDENT_AMBULATORY_CARE_PROVIDER_SITE_OTHER): Payer: Medicare Other | Admitting: Physician Assistant

## 2023-10-15 VITALS — BP 130/89 | HR 78 | Ht 65.0 in | Wt 170.5 lb

## 2023-10-15 DIAGNOSIS — Z780 Asymptomatic menopausal state: Secondary | ICD-10-CM

## 2023-10-15 DIAGNOSIS — F17200 Nicotine dependence, unspecified, uncomplicated: Secondary | ICD-10-CM

## 2023-10-15 DIAGNOSIS — E782 Mixed hyperlipidemia: Secondary | ICD-10-CM | POA: Diagnosis not present

## 2023-10-15 DIAGNOSIS — C439 Malignant melanoma of skin, unspecified: Secondary | ICD-10-CM | POA: Insufficient documentation

## 2023-10-15 DIAGNOSIS — E781 Pure hyperglyceridemia: Secondary | ICD-10-CM

## 2023-10-15 DIAGNOSIS — I10 Essential (primary) hypertension: Secondary | ICD-10-CM | POA: Diagnosis not present

## 2023-10-15 DIAGNOSIS — Z79899 Other long term (current) drug therapy: Secondary | ICD-10-CM

## 2023-10-15 DIAGNOSIS — Z1329 Encounter for screening for other suspected endocrine disorder: Secondary | ICD-10-CM

## 2023-10-15 NOTE — Progress Notes (Signed)
 Established Patient Office Visit  Subjective   Patient ID: Olivia Frank, female    DOB: 1950/09/21  Age: 73 y.o. MRN: [id number]  Chief Complaint  Patient presents with   Medical Management of Chronic Issues    41m f/u    HPI Pt is a 73 yo female with COPD, Aortic atherosclerosis, CAD who presents to the clinic for follow up.    She does continue to smoke daily. She denies any CP, SOB, headaches or vision changes. She is not checking her BP.  She is overall concerned with her CV health due to her high risk score.   .. Active Ambulatory Problems    Diagnosis Date Noted   Melanoma of skin (HCC) 04/20/2009   Sinusitis, chronic 09/22/2009   Postmenopausal 05/18/2009   Other specified chronic obstructive pulmonary disease (HCC) 10/04/2010   Low back pain 04/14/2012   Fracture of radial head, right, closed 10/08/2013   Left wrist sprain 10/08/2013   Primary osteoarthritis of both knees 11/29/2014   Stress at home 05/17/2015   Cerumen impaction 05/17/2015   Hyperlipidemia 06/06/2015   Hypertriglyceridemia 06/06/2015   Cervical spondylosis with radiculopathy 11/06/2015   Basal cell carcinoma of left lower leg 03/22/2016   Hyperplastic colon polyp 12/23/2016   Essential hypertension 12/23/2016   Pulmonary nodule, right 12/26/2016   Emphysema lung (HCC) 12/26/2016   Coronary atherosclerosis 12/26/2016   Aortic atherosclerosis (HCC) 12/26/2016   Tobacco dependence 11/03/2019   Hoarse voice quality 11/03/2019   Current smoker 11/03/2019   Newly recognized heart murmur 11/03/2019   Elevated BP without diagnosis of hypertension 11/03/2020   Encounter for screening for lung cancer 12/29/2020   Lateral epicondylitis, left elbow 10/03/2021   Bimalleolar fracture of right ankle status post ORIF, posterior fibular hardware removal 03/13/2023   Pulmonary hypertension, primary (HCC) 04/11/2023   Family history of heart disease 04/11/2023   Family history of early CAD 04/11/2023    Recurrent skin melanoma (HCC) 10/15/2023   Resolved Ambulatory Problems    Diagnosis Date Noted   HYPERLIPIDEMIA 05/18/2009   EUSTACHIAN TUBE DYSFUNCTION, RIGHT 04/20/2009   Abnormal mammogram 07/18/2009   Pneumonia, organism unspecified(486) 09/20/2010   Felon 05/27/2011   Elevated blood pressure 05/17/2015   Bilateral impacted cerumen 01/28/2018   Toe pain, right 11/03/2019   Past Medical History:  Diagnosis Date   COPD (chronic obstructive pulmonary disease) (HCC)    Melanoma (HCC)    Seasonal allergies     Review of Systems  All other systems reviewed and are negative.     Objective:     BP 130/89   Pulse 78   Ht 5\' 5"  (1.651 m)   Wt 170 lb 8 oz (77.3 kg)   SpO2 95%   BMI 28.37 kg/m  BP Readings from Last 3 Encounters:  10/15/23 130/89  05/15/23 (!) 157/79  04/11/23 (!) 146/76   Wt Readings from Last 3 Encounters:  10/15/23 170 lb 8 oz (77.3 kg)  04/11/23 172 lb (78 kg)  04/01/23 173 lb (78.5 kg)   SpO2 Readings from Last 3 Encounters:  10/15/23 95%  04/11/23 99%  10/11/22 97%   Physical Exam Constitutional:      Appearance: She is obese.  HENT:     Head: Normocephalic and atraumatic.  Eyes:     Extraocular Movements: Extraocular movements intact.  Cardiovascular:     Rate and Rhythm: Normal rate and regular rhythm.     Pulses: Normal pulses.     Heart sounds: Murmur heard.  Pulmonary:     Effort: Pulmonary effort is normal.     Breath sounds: Normal breath sounds.  Musculoskeletal:        General: Normal range of motion.     Cervical back: Normal range of motion.  Skin:    General: Skin is warm.  Neurological:     Mental Status: She is alert.  Psychiatric:        Mood and Affect: Mood normal.    Last CBC Lab Results  Component Value Date   WBC 8.4 10/15/2023   HGB 15.8 10/15/2023   HCT 47.6 (H) 10/15/2023   MCV 94 10/15/2023   MCH 31.2 10/15/2023   RDW 11.9 10/15/2023   PLT 290 10/15/2023   Last metabolic panel Lab Results   Component Value Date   GLUCOSE 94 10/15/2023   NA 144 10/15/2023   K 4.3 10/15/2023   CL 103 10/15/2023   CO2 23 10/15/2023   BUN 13 10/15/2023   CREATININE 0.63 10/15/2023   EGFR 94 10/15/2023   CALCIUM 9.2 10/15/2023   PROT 6.1 10/15/2023   ALBUMIN 4.2 10/15/2023   LABGLOB 1.9 10/15/2023   BILITOT 0.4 10/15/2023   ALKPHOS 100 10/15/2023   AST 23 10/15/2023   ALT 29 10/15/2023   Last lipids Lab Results  Component Value Date   CHOL 184 10/15/2023   HDL 77 10/15/2023   LDLCALC 84 10/15/2023   TRIG 138 10/15/2023   CHOLHDL 2.4 10/15/2023    Last thyroid functions Lab Results  Component Value Date   TSH 1.990 10/15/2023   Last vitamin D Lab Results  Component Value Date   VD25OH 78 06/05/2015      The 10-year ASCVD risk score (Arnett DK, et al., 2019) is: 19.2%    Assessment & Plan:  Marland KitchenMarland KitchenXochilt was seen today for medical management of chronic issues.  Diagnoses and all orders for this visit:  Essential hypertension -     CMP14+EGFR -     CBC w/Diff/Platelet  Recurrent skin melanoma (HCC)  Current smoker -     CBC w/Diff/Platelet  Mixed hyperlipidemia -     Lipid panel  Hypertriglyceridemia -     Lipid panel  Thyroid disorder screening -     TSH  Post-menopausal  Medication management -     Lipid panel -     CMP14+EGFR -     CBC w/Diff/Platelet  Tobacco dependence   Pt is doing great.  Vitals look good. BP improved on 2nd recheck.  Recheck fasting labs today Continue on lipitor daily.  Pt is not willing to discuss smoking cessation Lung cancer screening CT due for August.   Tandy Gaw, PA-C

## 2023-10-16 LAB — CMP14+EGFR
ALT: 29 IU/L (ref 0–32)
AST: 23 IU/L (ref 0–40)
Albumin: 4.2 g/dL (ref 3.8–4.8)
Alkaline Phosphatase: 100 IU/L (ref 44–121)
BUN/Creatinine Ratio: 21 (ref 12–28)
BUN: 13 mg/dL (ref 8–27)
Bilirubin Total: 0.4 mg/dL (ref 0.0–1.2)
CO2: 23 mmol/L (ref 20–29)
Calcium: 9.2 mg/dL (ref 8.7–10.3)
Chloride: 103 mmol/L (ref 96–106)
Creatinine, Ser: 0.63 mg/dL (ref 0.57–1.00)
Globulin, Total: 1.9 g/dL (ref 1.5–4.5)
Glucose: 94 mg/dL (ref 70–99)
Potassium: 4.3 mmol/L (ref 3.5–5.2)
Sodium: 144 mmol/L (ref 134–144)
Total Protein: 6.1 g/dL (ref 6.0–8.5)
eGFR: 94 mL/min/{1.73_m2} (ref >59)

## 2023-10-16 LAB — CBC WITH DIFFERENTIAL/PLATELET
Basophils Absolute: 0 10*3/uL (ref 0.0–0.2)
Basos: 1 %
EOS (ABSOLUTE): 0.2 10*3/uL (ref 0.0–0.4)
Eos: 3 %
Hematocrit: 47.6 % — ABNORMAL HIGH (ref 34.0–46.6)
Hemoglobin: 15.8 g/dL (ref 11.1–15.9)
Immature Grans (Abs): 0 10*3/uL (ref 0.0–0.1)
Immature Granulocytes: 0 %
Lymphocytes Absolute: 2.5 10*3/uL (ref 0.7–3.1)
Lymphs: 30 %
MCH: 31.2 pg (ref 26.6–33.0)
MCHC: 33.2 g/dL (ref 31.5–35.7)
MCV: 94 fL (ref 79–97)
Monocytes Absolute: 0.7 10*3/uL (ref 0.1–0.9)
Monocytes: 8 %
Neutrophils Absolute: 4.9 10*3/uL (ref 1.4–7.0)
Neutrophils: 58 %
Platelets: 290 10*3/uL (ref 150–450)
RBC: 5.07 x10E6/uL (ref 3.77–5.28)
RDW: 11.9 % (ref 11.7–15.4)
WBC: 8.4 10*3/uL (ref 3.4–10.8)

## 2023-10-16 LAB — LIPID PANEL
Chol/HDL Ratio: 2.4 ratio (ref 0.0–4.4)
Cholesterol, Total: 184 mg/dL (ref 100–199)
HDL: 77 mg/dL (ref >39)
LDL Chol Calc (NIH): 84 mg/dL (ref 0–99)
Triglycerides: 138 mg/dL (ref 0–149)
VLDL Cholesterol Cal: 23 mg/dL (ref 5–40)

## 2023-10-16 LAB — TSH: TSH: 1.99 u[IU]/mL (ref 0.450–4.500)

## 2023-10-17 ENCOUNTER — Encounter: Payer: Self-pay | Admitting: Physician Assistant

## 2023-10-17 NOTE — Progress Notes (Signed)
 Olivia Frank,   Cholesterol looks good.  Kidney, liver, glucose look great.  Thyroid normal.

## 2024-03-10 ENCOUNTER — Telehealth: Payer: Self-pay | Admitting: Acute Care

## 2024-03-10 NOTE — Telephone Encounter (Signed)
 Left VM to call [phone] for LDCT annual appt

## 2024-04-06 ENCOUNTER — Ambulatory Visit

## 2024-04-06 DIAGNOSIS — Z122 Encounter for screening for malignant neoplasm of respiratory organs: Secondary | ICD-10-CM

## 2024-04-06 DIAGNOSIS — F17210 Nicotine dependence, cigarettes, uncomplicated: Secondary | ICD-10-CM

## 2024-04-06 DIAGNOSIS — Z87891 Personal history of nicotine dependence: Secondary | ICD-10-CM

## 2024-04-13 ENCOUNTER — Encounter: Payer: Self-pay | Admitting: Physician Assistant

## 2024-04-13 ENCOUNTER — Ambulatory Visit (INDEPENDENT_AMBULATORY_CARE_PROVIDER_SITE_OTHER): Payer: Medicare Other | Admitting: Physician Assistant

## 2024-04-13 VITALS — BP 140/70 | HR 76 | Ht 65.0 in | Wt 176.0 lb

## 2024-04-13 DIAGNOSIS — I700 Atherosclerosis of aorta: Secondary | ICD-10-CM

## 2024-04-13 DIAGNOSIS — E782 Mixed hyperlipidemia: Secondary | ICD-10-CM

## 2024-04-13 DIAGNOSIS — F17200 Nicotine dependence, unspecified, uncomplicated: Secondary | ICD-10-CM

## 2024-04-13 DIAGNOSIS — J432 Centrilobular emphysema: Secondary | ICD-10-CM

## 2024-04-13 DIAGNOSIS — I2510 Atherosclerotic heart disease of native coronary artery without angina pectoris: Secondary | ICD-10-CM

## 2024-04-13 DIAGNOSIS — I10 Essential (primary) hypertension: Secondary | ICD-10-CM

## 2024-04-13 DIAGNOSIS — I270 Primary pulmonary hypertension: Secondary | ICD-10-CM | POA: Diagnosis not present

## 2024-04-13 MED ORDER — LOSARTAN POTASSIUM 25 MG PO TABS
25.0000 mg | ORAL_TABLET | Freq: Every day | ORAL | 0 refills | Status: DC
Start: 2024-04-13 — End: 2024-05-28

## 2024-04-13 MED ORDER — ATORVASTATIN CALCIUM 40 MG PO TABS: ORAL_TABLET | ORAL | 3 refills | Status: AC

## 2024-04-13 NOTE — Progress Notes (Unsigned)
   Established Patient Office Visit  Subjective   Patient ID: Olivia Frank, female    DOB: 1951/07/26  Age: 73 y.o. MRN: [id number]  HPI Pt is a 73 yo female with COPD, HTN, Pulmonary HTN, Aortic atherosclerosis who presents to the clinic for 6 month follow up.   She is doing well. She continues to smoke, and not going to quit. She denies any CP, palpitations, headaches, SOB. She is active. She has done her lung cancer screening but not been read yet. She is taking her lipitor daily.   Review of Systems  All other systems reviewed and are negative.     Objective:     BP (!) 140/70   Pulse 76   Ht 5' 5 (1.651 m)   Wt 176 lb (79.8 kg)   SpO2 97%   BMI 29.29 kg/m  BP Readings from Last 3 Encounters:  04/13/24 (!) 140/70  10/15/23 130/89  05/15/23 (!) 157/79   Wt Readings from Last 3 Encounters:  04/13/24 176 lb (79.8 kg)  04/06/24 170 lb (77.1 kg)  10/15/23 170 lb 8 oz (77.3 kg)      Physical Exam Constitutional:      Appearance: Normal appearance.  HENT:     Head: Normocephalic.  Cardiovascular:     Rate and Rhythm: Normal rate and regular rhythm.     Heart sounds: Murmur heard.  Pulmonary:     Effort: Pulmonary effort is normal.     Breath sounds: Normal breath sounds.  Neurological:     General: No focal deficit present.     Mental Status: She is alert and oriented to person, place, and time.  Psychiatric:        Mood and Affect: Mood normal.       The 10-year ASCVD risk score (Arnett DK, et al., 2019) is: 28.4%    Assessment & Plan:  SABRASABRAZurie was seen today for medical management of chronic issues.  Diagnoses and all orders for this visit:  Essential hypertension -     losartan  (COZAAR ) 25 MG tablet; Take 1 tablet (25 mg total) by mouth daily.  Centrilobular emphysema (HCC)  Tobacco dependence  Pulmonary hypertension, primary (HCC) -     losartan  (COZAAR ) 25 MG tablet; Take 1 tablet (25 mg total) by mouth daily.  Mixed hyperlipidemia -      atorvastatin  (LIPITOR) 40 MG tablet; Take 1 tablet by mouth once daily.  Aortic atherosclerosis (HCC) -     atorvastatin  (LIPITOR) 40 MG tablet; Take 1 tablet by mouth once daily.  Atherosclerosis of native coronary artery of native heart without angina pectoris -     atorvastatin  (LIPITOR) 40 MG tablet; Take 1 tablet by mouth once daily.   Pt declines smoking cessation Lung cancer screening done aug 19th but not read yet BP not to goal start losartan  and discussed ways to lower BP Encouraged for patient to start checking BP at home 2 weeks nurse visit BP check   Return in about 6 months (around 10/14/2024) for Needs to schedule Medicare Wellness Exam and 2 week nurse isit BP check.    Parker Wherley, PA-C

## 2024-04-20 ENCOUNTER — Other Ambulatory Visit: Payer: Self-pay | Admitting: Acute Care

## 2024-04-20 ENCOUNTER — Encounter: Payer: Self-pay | Admitting: Sports Medicine

## 2024-04-20 DIAGNOSIS — F17210 Nicotine dependence, cigarettes, uncomplicated: Secondary | ICD-10-CM

## 2024-04-20 DIAGNOSIS — Z87891 Personal history of nicotine dependence: Secondary | ICD-10-CM

## 2024-04-20 DIAGNOSIS — Z122 Encounter for screening for malignant neoplasm of respiratory organs: Secondary | ICD-10-CM

## 2024-04-26 ENCOUNTER — Ambulatory Visit (INDEPENDENT_AMBULATORY_CARE_PROVIDER_SITE_OTHER)

## 2024-04-26 VITALS — BP 148/64 | HR 70 | Ht 65.0 in

## 2024-04-26 DIAGNOSIS — I10 Essential (primary) hypertension: Secondary | ICD-10-CM | POA: Diagnosis not present

## 2024-04-26 NOTE — Progress Notes (Signed)
   Established Patient Office Visit  Subjective   Patient ID: Carlee Vonderhaar, female    DOB: May 08, 1951  Age: 73 y.o. MRN: [id number]  Chief Complaint  Patient presents with   Hypertension    BP check - nurse visit    HPI  Hypertension, BP check nurse visit. Patient denies shortness of breath, dizziness, palpitations, chest pain, vision changes or medication problems. Patient has been taking Losartan  as prescribed.   ROS    Objective:     BP (!) 148/64   Pulse 70   Ht 5' 5 (1.651 m)   SpO2 98%   BMI 29.29 kg/m    Physical Exam   No results found for any visits on 04/26/24.    The 10-year ASCVD risk score (Arnett DK, et al., 2019) is: 31.2%    Assessment & Plan:  BP check nurse visit. Intitial reading 155/68. Second reading - 148/64. Per Vermell Bologna, PA  increase Losartan  to 50mg   and schedule a nurse visit in 2 to 3 weeks for BP check.  Problem List Items Addressed This Visit       Cardiovascular and Mediastinum   Essential hypertension - Primary    Return in about 2 weeks (around 05/10/2024) for nurse visit for BP check.    Suzen SHAUNNA Plenty, LPN

## 2024-04-26 NOTE — Patient Instructions (Signed)
 Increase Losartan  to 50mg   and schedule a nurse visit in 2 to 3 weeks for BP check.

## 2024-05-05 ENCOUNTER — Encounter: Payer: Self-pay | Admitting: Physician Assistant

## 2024-05-10 ENCOUNTER — Ambulatory Visit (INDEPENDENT_AMBULATORY_CARE_PROVIDER_SITE_OTHER)

## 2024-05-10 VITALS — BP 130/75 | HR 74 | Resp 17 | Ht 65.0 in

## 2024-05-10 DIAGNOSIS — I10 Essential (primary) hypertension: Secondary | ICD-10-CM | POA: Diagnosis not present

## 2024-05-10 NOTE — Progress Notes (Signed)
 Pt here for BP 2 wk recheck. BP 148/64 last office visit. Denies CP, SOB, or headaches.  Pt first BP 147/74 was pt sat for 10 mins and then rechecked. Second reading was 130/75. Reported off to [employer], PA-C pt is to stay on same regiment and f/u at routine OV.

## 2024-05-27 ENCOUNTER — Ambulatory Visit (INDEPENDENT_AMBULATORY_CARE_PROVIDER_SITE_OTHER)

## 2024-05-27 ENCOUNTER — Other Ambulatory Visit: Payer: Self-pay | Admitting: Physician Assistant

## 2024-05-27 VITALS — Ht 64.0 in | Wt 175.0 lb

## 2024-05-27 DIAGNOSIS — Z0000 Encounter for general adult medical examination without abnormal findings: Secondary | ICD-10-CM | POA: Diagnosis not present

## 2024-05-27 DIAGNOSIS — Z1231 Encounter for screening mammogram for malignant neoplasm of breast: Secondary | ICD-10-CM

## 2024-05-27 NOTE — Patient Instructions (Signed)
 [name] , Thank you for taking time to come for your Medicare Wellness Visit. I appreciate your ongoing commitment to your health goals. Please review the following plan we discussed and let me know if I can assist you in the future.   These are the goals we discussed:  Goals       Patient Stated      Patient stated she would like to continue to loose weight. Has lost 5 lbs.      Patient Stated (pt-stated)      12/29/2020 AWV Goal: Tobacco Cessation  Smoking cessation instruction/counseling given:  counseled patient on the dangers of tobacco use, advised patient to stop smoking, and reviewed strategies to maximize success  Patient will verbalize understanding of the health risks associated with smoking/tobacco use Lung cancer or lung disease, such as COPD Heart disease. Stroke. Heart attack Infertility Osteoporosis and bone fractures. Patient will create a plan to quit smoking/using tobacco Pick a date to quit.  Write down the reasons why you are quitting and put it where you will see it often. Identify the people, places, things, and activities that make you want to smoke (triggers) and avoid them. Make sure to take these actions: Throw away all cigarettes at home, at work, and in your car. Throw away smoking accessories, such as Set designer. Clean your car and make sure to empty the ashtray. Clean your home, including curtains and carpets. Tell your family, friends, and coworkers that you are quitting. Support from your loved ones can make quitting easier. Talk with your health care provider about your options for quitting smoking. Find out what treatment options are covered by your health insurance. Patient will be able to demonstrate knowledge of tobacco cessation strategies that may maximize success Quitting "cold [place]" is more successful than gradually quitting. Attending in-person counseling to help you build problem-solving skills.  Finding resources and  support systems that can help you to quit smoking and remain smoke-free after you quit. These resources are most helpful when you use them often. They can include: Online chats with a Veterinary surgeon. Telephone quitlines. Printed Materials engineer. Support groups or group counseling. Text messaging programs. Mobile phone applications. Taking medicines to help you quit smoking: Nicotine patches, gum, or lozenges. Nicotine inhalers or sprays. Non-nicotine medicine that is taken by mouth. Patient will note get discouraged if the process is difficult Over the next year, patient will stop smoking or using other forms of tobacco  Smoking cessation instruction/counseling given:  counseled patient on the dangers of tobacco use, advised patient to stop smoking, and reviewed strategies to maximize success        Patient Stated      Would like to loose 5-10 lbs.      Patient Stated (pt-stated)      Patient stated that she would like to be able to walk 2 miles a day like the way she was prior her injury.      Patient Stated      Patient states she would like to lose 10 pounds.       Weight (lb) < 200 lb (90.7 kg)      Patient would like to loose weight in the next year at least 10lbs        This is a list of the screening recommended for you and due dates:  Health Maintenance  Topic Date Due   Breast Cancer Screening  04/25/2024   Screening for Lung Cancer  04/06/2025  Medicare Annual Wellness Visit  05/27/2025   Colon Cancer Screening  12/20/2026   DTaP/Tdap/Td vaccine (4 - Td or Tdap) 03/19/2029   Pneumococcal Vaccine for age over 78  Completed   Flu Shot  Completed   DEXA scan (bone density measurement)  Completed   Hepatitis C Screening  Completed   Zoster (Shingles) Vaccine  Completed   Meningitis B Vaccine  Aged Out   COVID-19 Vaccine  Discontinued

## 2024-05-27 NOTE — Progress Notes (Signed)
 Subjective:   Olivia Frank is a 73 y.o. female who presents for Medicare Annual (Subsequent) preventive examination.  Visit Complete: Virtual I connected with  Olivia Frank on 05/27/24 by a audio enabled telemedicine application and verified that I am speaking with the correct person using two identifiers.  Patient Location: Home  Provider Location: Office/Clinic  I discussed the limitations of evaluation and management by telemedicine. The patient expressed understanding and agreed to proceed.  Vital Signs: Because this visit was a virtual/telehealth visit, some criteria may be missing or patient reported. Any vitals not documented were not able to be obtained and vitals that have been documented are patient reported.  Patient Medicare AWV questionnaire was completed by the patient on n/a; I have confirmed that all information answered by patient is correct and no changes since this date.  Cardiac Risk Factors include: smoking/ tobacco exposure;advanced age (>48men, >36 women);dyslipidemia;family history of premature cardiovascular disease;sedentary lifestyle     Objective:    Today's Vitals   05/27/24 1508 05/27/24 1509  Weight: 175 lb (79.4 kg)   Height: 5' 4 (1.626 m)   PainSc:  3    Body mass index is 30.04 kg/m.     05/27/2024    3:17 PM 01/21/2023   10:01 AM 01/15/2022    3:05 PM 12/29/2020    1:31 PM 08/03/2019    9:19 AM 07/29/2018    9:09 AM 11/06/2015    3:16 PM  Advanced Directives  Does Patient Have a Medical Advance Directive? Yes Yes Yes Yes Yes Yes  No   Type of Estate agent of [place];Living will Living will Living will;Healthcare Power of [employer] Power of [place];Living will Healthcare Power of [place];Living will Healthcare Power of [place];Living will   Does patient want to make changes to medical advance directive? No - Patient declined No - Patient declined No - Patient declined  No - Patient declined No -  Patient declined    Copy of Healthcare Power of Attorney in Chart?   Yes - validated most recent copy scanned in chart (See row information) No - copy requested No - copy requested No - copy requested    Would patient like information on creating a medical advance directive?    No - Patient declined   No - patient declined information      Data saved with a previous flowsheet row definition    Current Medications (verified) Outpatient Encounter Medications as of 05/27/2024  Medication Sig   acetaminophen  (TYLENOL ) 650 MG CR tablet Take 1 tablet (650 mg total) by mouth every 8 (eight) hours as needed for pain.   atorvastatin  (LIPITOR) 40 MG tablet Take 1 tablet by mouth once daily.   losartan  (COZAAR ) 25 MG tablet Take 1 tablet (25 mg total) by mouth daily. (Patient taking differently: Take 50 mg by mouth daily.)   Omega-3 Fatty Acids (FISH OIL) 1000 MG CAPS Take by mouth.   vitamin B-12 (CYANOCOBALAMIN) 1000 MCG tablet Take 1,000 mcg by mouth daily.   VITAMIN D , CHOLECALCIFEROL, PO Take 5,000 Units 3 (three) times daily by mouth.    No facility-administered encounter medications on file as of 05/27/2024.    Allergies (verified) Morphine and codeine and Ace inhibitors   History: Past Medical History:  Diagnosis Date   COPD (chronic obstructive pulmonary disease) (HCC)    Melanoma (HCC)    Seasonal allergies    Past Surgical History:  Procedure Laterality Date   ABDOMINAL HYSTERECTOMY  ANKLE HARDWARE REMOVAL Right 12/05/2022   APPENDECTOMY     OVARIAN CYST REMOVAL     Family History  Problem Relation Age of Onset   Cancer Mother        breast   Hyperlipidemia Mother    Heart disease Father    Cancer Sister    Seizures Sister    Cancer Maternal Grandmother        breast   Cancer Maternal Grandfather        colon   Social History   Socioeconomic History   Marital status: Married    Spouse name: Olivia Frank   Number of children: 2   Years of education: 12   Highest  education level: 12th grade  Occupational History   Occupation: retired    Comment: Data processing manager for [employer]  Tobacco Use   Smoking status: Every Day    Current packs/day: 1.00    Average packs/day: 1 pack/day for 52.0 years (52.0 ttl pk-yrs)    Types: Cigarettes   Smokeless tobacco: Never  Vaping Use   Vaping status: Never Used  Substance and Sexual Activity   Alcohol use: Yes    Alcohol/week: 3.0 standard drinks of alcohol    Types: 3 Glasses of wine per week    Comment: a night with dinner   Drug use: No   Sexual activity: Not Currently  Other Topics Concern   Not on file  Social History Narrative   Her son is living with her at the moment. She had to move her husband into an assisted living facility. She is still smoking one pack per day. She enjoys reading and walking.   Social Drivers of Corporate investment banker Strain: Low Risk  (05/27/2024)   Overall Financial Resource Strain (CARDIA)    Difficulty of Paying Living Expenses: Not hard at all  Food Insecurity: No Food Insecurity (05/27/2024)   Hunger Vital Sign    Worried About Running Out of Food in the Last Year: Never true    Ran Out of Food in the Last Year: Never true  Transportation Needs: No Transportation Needs (05/27/2024)   PRAPARE - Administrator, Civil Service (Medical): No    Lack of Transportation (Non-Medical): No  Physical Activity: Sufficiently Active (05/27/2024)   Exercise Vital Sign    Days of Exercise per Week: 7 days    Minutes of Exercise per Session: 30 min  Stress: No Stress Concern Present (05/27/2024)   [employer] of Occupational Health - Occupational Stress Questionnaire    Feeling of Stress: Not at all  Social Connections: Moderately Integrated (05/27/2024)   Social Connection and Isolation Panel    Frequency of Communication with Friends and Family: More than three times a week    Frequency of Social Gatherings with Friends and Family: More than three times  a week    Attends Religious Services: Never    Database administrator or Organizations: Yes    Attends Engineer, structural: More than 4 times per year    Marital Status: Married    Tobacco Counseling Ready to quit: Not Answered Counseling given: Not Answered   Clinical Intake:  Pre-visit preparation completed: Yes  Pain : 0-10 Pain Score: 3  Pain Type: Acute pain Pain Location: Wrist Pain Orientation: Left Pain Onset: 1 to 4 weeks ago Pain Frequency: Constant     BMI - recorded: 30.04 Nutritional Status: BMI > 30  Obese Nutritional Risks: None Diabetes: No  How often do you need to have someone help you when you read instructions, pamphlets, or other written materials from your doctor or pharmacy?: 1 - Never What is the last grade level you completed in school?: 12  Interpreter Needed?: No      Activities of Daily Living    05/27/2024    3:10 PM  In your present state of health, do you have any difficulty performing the following activities:  Hearing? 0  Vision? 1  Difficulty concentrating or making decisions? 0  Walking or climbing stairs? 0  Dressing or bathing? 0  Doing errands, shopping? 0  Preparing Food and eating ? N  Using the Toilet? N  In the past six months, have you accidently leaked urine? N  Do you have problems with loss of bowel control? N  Managing your Medications? N  Managing your Finances? N  Housekeeping or managing your Housekeeping? N    Patient Care Team: Olivia Frank, Olivia Frank, Olivia Frank as PCP - General (Family Medicine) Olivia Cough, Olivia Frank as Referring Physician (Ophthalmology) Olivia Franky SAUNDERS, Olivia Frank as Attending Physician (Dermatology)  Indicate any recent Medical Services you may have received from other than [facility] providers in the past year (date may be approximate).     Assessment:   This is a routine wellness examination for Olivia Frank.  Hearing/Vision screen No results found.   Goals Addressed             This  Visit's Progress    Patient Stated       Patient states she would like to lose 10 pounds.        Depression Screen    05/27/2024    3:16 PM 04/13/2024    9:16 AM 04/11/2023    8:06 AM 01/21/2023   10:02 AM 10/11/2022    8:19 AM 04/10/2022    8:33 AM 01/15/2022    3:01 PM  PHQ 2/9 Scores  PHQ - 2 Score 0 0 0 0 0 0 0    Fall Risk    05/27/2024    3:17 PM 01/21/2023   10:01 AM 10/11/2022    8:19 AM 01/15/2022    3:01 PM 12/29/2020    1:22 PM  Fall Risk   Falls in the past year? 1 1 0 0 0  Number falls in past yr: 0 0 0 0 0  Injury with Fall? 1 1 0 0 0  Risk for fall due to : Impaired balance/gait Impaired mobility;History of fall(s) No Fall Risks No Fall Risks No Fall Risks  Follow up Falls evaluation completed Falls evaluation completed;Education provided;Falls prevention discussed Falls evaluation completed Falls evaluation completed  Falls evaluation completed      Data saved with a previous flowsheet row definition    MEDICARE RISK AT HOME: Medicare Risk at Home Any stairs in or around the home?: Yes If so, are there any without handrails?: Yes Home free of loose throw rugs in walkways, pet beds, electrical cords, etc?: Yes Adequate lighting in your home to reduce risk of falls?: Yes Life alert?: No Use of a cane, walker or w/c?: No Grab bars in the bathroom?: Yes Shower chair or bench in shower?: Yes Elevated toilet seat or a handicapped toilet?: No  TIMED UP AND GO:  Was the test performed?  No    Cognitive Function:        05/27/2024    3:18 PM 01/21/2023   10:10 AM 01/15/2022    3:05 PM 12/29/2020    1:34  PM 11/03/2019    8:11 AM  6CIT Screen  What Year? 0 points 0 points 0 points 0 points 0 points  What month? 0 points 0 points 0 points 0 points 0 points  What time? 0 points 0 points 0 points 0 points 0 points  Count back from 20 0 points 0 points 0 points 0 points 0 points  Months in reverse 0 points 0 points 0 points 0 points 0 points  Repeat phrase 0 points  0 points 0 points 0 points 2 points  Total Score 0 points 0 points 0 points 0 points 2 points    Immunizations Immunization History  Administered Date(s) Administered   Fluad Quad(high Dose 65+) 04/06/2019, 06/01/2020, 05/17/2021, 06/13/2022   INFLUENZA, HIGH DOSE SEASONAL PF 05/26/2018, 04/11/2023   Influenza,inj,Quad PF,6+ Mos 06/23/2017, 06/24/2017   Influenza-Unspecified 06/23/2017, 06/24/2017, 04/06/2019, 06/01/2020, 05/17/2021, 06/13/2022, 05/05/2024   PFIZER(Purple Top)SARS-COV-2 Vaccination 09/16/2019, 10/14/2019, 05/17/2020   Pneumococcal Conjugate-13 11/25/2016   Pneumococcal Polysaccharide-23 01/28/2018   Td 05/18/2009   Td (Adult) 05/18/2009   Tdap 03/20/2019   Zoster Recombinant(Shingrix) 12/04/2021, 03/14/2022   Zoster, Live 10/15/2011    TDAP status: Up to date  Flu Vaccine status: Up to date  Pneumococcal vaccine status: Up to date  Covid-19 vaccine status: Declined, Education has been provided regarding the importance of this vaccine but patient still declined. Advised may receive this vaccine at local pharmacy or Health Dept.or vaccine clinic. Aware to provide a copy of the vaccination record if obtained from local pharmacy or Health Dept. Verbalized acceptance and understanding.  Qualifies for Shingles Vaccine? Yes   Zostavax completed Yes   Shingrix Completed?: Yes  Screening Tests Health Maintenance  Topic Date Due   Mammogram  04/25/2024   Lung Cancer Screening  04/06/2025   Medicare Annual Wellness (AWV)  05/27/2025   Colonoscopy  12/20/2026   DTaP/Tdap/Td (4 - Td or Tdap) 03/19/2029   Pneumococcal Vaccine: 50+ Years  Completed   Influenza Vaccine  Completed   DEXA SCAN  Completed   Hepatitis C Screening  Completed   Zoster Vaccines- Shingrix  Completed   Meningococcal B Vaccine  Aged Out   COVID-19 Vaccine  Discontinued    Health Maintenance  Health Maintenance Due  Topic Date Due   Mammogram  04/25/2024    Colorectal cancer screening:  Type of screening: Colonoscopy. Completed 12/19/2016. Repeat every 10 years  Mammogram status: Ordered 05/27/2024. Pt provided with contact info and advised to call to schedule appt.   Bone Density status: Completed 10/23/2022. Results reflect: Bone density results: NORMAL. Repeat every 5 years.  Lung Cancer Screening: (Low Dose CT Chest recommended if Age 68-80 years, 20 pack-year currently smoking OR have quit w/in 15years.) does qualify.   Lung Cancer Screening Referral: patient already in program  Additional Screening:  Hepatitis C Screening: does qualify; Completed 06/05/2015  Vision Screening: Recommended annual ophthalmology exams for early detection of glaucoma and other disorders of the eye. Is the patient up to date with their annual eye exam?  Yes  Who is the provider or what is the name of the office in which the patient attends annual eye exams? Dr Olivia If pt is not established with a provider, would they like to be referred to a provider to establish care? N/a.   Dental Screening: Recommended annual dental exams for proper oral hygiene   Community Resource Referral / Chronic Care Management: CRR required this visit?  No   CCM required this visit?  No  Plan:     I have personally reviewed and noted the following in the patient's chart:   Medical and social history Use of alcohol, tobacco or illicit drugs  Current medications and supplements including opioid prescriptions. Patient is not currently taking opioid prescriptions. Functional ability and status Nutritional status Physical activity Advanced directives List of other physicians Hospitalizations, surgeries, and ER visits in previous 12 months. None Vitals Screenings to include cognitive, depression, and falls Referrals and appointments  In addition, I have reviewed and discussed with patient certain preventive protocols, quality metrics, and best practice recommendations. A written personalized  care plan for preventive services as well as general preventive health recommendations were provided to patient.     Olivia Frank, Olivia Frank   05/27/2024   After Visit Summary: (MyChart) Due to this being a telephonic visit, the after visit summary with patients personalized plan was offered to patient via MyChart   Nurse Notes:   Olivia Frank is a 73 y.o. female patient of Olivia Vermell CROME, Olivia Frank who had a [employer] Visit today via telephone. Olivia Frank is Retired and lives with their son. She has 2 children. she reports that she is socially active and does interact with friends/family regularly. She is moderately physically active and enjoys reading.

## 2024-05-28 ENCOUNTER — Encounter: Payer: Self-pay | Admitting: Physician Assistant

## 2024-05-28 MED ORDER — LOSARTAN POTASSIUM 50 MG PO TABS: 50.0000 mg | ORAL_TABLET | Freq: Every day | ORAL | 1 refills | Status: AC

## 2024-06-10 ENCOUNTER — Inpatient Hospital Stay (HOSPITAL_BASED_OUTPATIENT_CLINIC_OR_DEPARTMENT_OTHER): Admission: RE | Admit: 2024-06-10 | Source: Ambulatory Visit

## 2024-06-10 ENCOUNTER — Encounter (HOSPITAL_BASED_OUTPATIENT_CLINIC_OR_DEPARTMENT_OTHER): Payer: Self-pay

## 2024-06-11 ENCOUNTER — Telehealth (HOSPITAL_BASED_OUTPATIENT_CLINIC_OR_DEPARTMENT_OTHER): Payer: Self-pay

## 2024-07-06 ENCOUNTER — Ambulatory Visit (HOSPITAL_BASED_OUTPATIENT_CLINIC_OR_DEPARTMENT_OTHER)
Admission: RE | Admit: 2024-07-06 | Discharge: 2024-07-06 | Disposition: A | Discharge status: Home / Self Care | Source: Ambulatory Visit | Attending: Physician Assistant | Admitting: Physician Assistant

## 2024-07-06 ENCOUNTER — Encounter (HOSPITAL_BASED_OUTPATIENT_CLINIC_OR_DEPARTMENT_OTHER): Payer: Self-pay

## 2024-07-06 DIAGNOSIS — Z1231 Encounter for screening mammogram for malignant neoplasm of breast: Secondary | ICD-10-CM | POA: Diagnosis present

## 2024-07-07 ENCOUNTER — Ambulatory Visit: Payer: Self-pay | Admitting: Physician Assistant

## 2024-07-07 NOTE — Progress Notes (Signed)
 Normal mammogram. Follow up in one year.

## 2024-10-13 ENCOUNTER — Ambulatory Visit: Admitting: Physician Assistant

## 2025-05-31 ENCOUNTER — Ambulatory Visit
